# Patient Record
Sex: Female | Born: 1955
Health system: Southern US, Community
[De-identification: ages and names within clinical notes are randomized; demographics above are authoritative.]

## PROBLEM LIST (undated history)

## (undated) DIAGNOSIS — E785 Hyperlipidemia, unspecified: Secondary | ICD-10-CM

## (undated) DIAGNOSIS — E079 Disorder of thyroid, unspecified: Secondary | ICD-10-CM

## (undated) DIAGNOSIS — E0590 Thyrotoxicosis, unspecified without thyrotoxic crisis or storm: Secondary | ICD-10-CM

## (undated) DIAGNOSIS — M1990 Unspecified osteoarthritis, unspecified site: Secondary | ICD-10-CM

## (undated) DIAGNOSIS — E039 Hypothyroidism, unspecified: Secondary | ICD-10-CM

## (undated) DIAGNOSIS — T7840XA Allergy, unspecified, initial encounter: Secondary | ICD-10-CM

## (undated) DIAGNOSIS — I10 Essential (primary) hypertension: Secondary | ICD-10-CM

## (undated) DIAGNOSIS — R945 Abnormal results of liver function studies: Secondary | ICD-10-CM

## (undated) DIAGNOSIS — R7303 Prediabetes: Secondary | ICD-10-CM

## (undated) DIAGNOSIS — R011 Cardiac murmur, unspecified: Secondary | ICD-10-CM

## (undated) DIAGNOSIS — Z9889 Other specified postprocedural states: Secondary | ICD-10-CM

## (undated) DIAGNOSIS — R030 Elevated blood-pressure reading, without diagnosis of hypertension: Secondary | ICD-10-CM

## (undated) DIAGNOSIS — Z973 Presence of spectacles and contact lenses: Secondary | ICD-10-CM

## (undated) DIAGNOSIS — J0100 Acute maxillary sinusitis, unspecified: Secondary | ICD-10-CM

## (undated) DIAGNOSIS — N951 Menopausal and female climacteric states: Secondary | ICD-10-CM

## (undated) DIAGNOSIS — R079 Chest pain, unspecified: Secondary | ICD-10-CM

## (undated) HISTORY — DX: Chest pain, unspecified: R07.9

## (undated) HISTORY — PX: NECK SURGERY: SHX720

## (undated) HISTORY — DX: Hyperlipidemia, unspecified: E78.5

## (undated) HISTORY — DX: Morbid (severe) obesity due to excess calories: E66.01

## (undated) HISTORY — PX: WISDOM TOOTH EXTRACTION: SHX21

## (undated) HISTORY — DX: Prediabetes: R73.03

## (undated) HISTORY — DX: Allergy, unspecified, initial encounter: T78.40XA

## (undated) HISTORY — DX: Unspecified osteoarthritis, unspecified site: M19.90

## (undated) HISTORY — DX: Disorder of thyroid, unspecified: E07.9

## (undated) HISTORY — DX: Elevated blood-pressure reading, without diagnosis of hypertension: R03.0

## (undated) HISTORY — DX: Menopausal and female climacteric states: N95.1

## (undated) HISTORY — PX: OTHER SURGICAL HISTORY: SHX169

## (undated) HISTORY — DX: Thyrotoxicosis, unspecified without thyrotoxic crisis or storm: E05.90

## (undated) HISTORY — DX: Abnormal results of liver function studies: R94.5

## (undated) HISTORY — PX: APPENDECTOMY: SHX54

## (undated) HISTORY — DX: Cardiac murmur, unspecified: R01.1

## (undated) HISTORY — DX: Acute maxillary sinusitis, unspecified: J01.00

---
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
## 1989-08-24 HISTORY — PX: TUBAL LIGATION: SHX77

## 1998-03-30 ENCOUNTER — Other Ambulatory Visit: Admission: RE | Admit: 1998-03-30 | Discharge: 1998-03-30 | Payer: Self-pay | Admitting: Gynecology

## 2000-02-08 ENCOUNTER — Other Ambulatory Visit: Admission: RE | Admit: 2000-02-08 | Discharge: 2000-02-08 | Payer: Self-pay | Admitting: Gynecology

## 2001-06-24 ENCOUNTER — Ambulatory Visit (HOSPITAL_COMMUNITY): Admission: RE | Admit: 2001-06-24 | Discharge: 2001-06-24 | Payer: Self-pay | Admitting: Pulmonary Disease

## 2001-07-10 ENCOUNTER — Encounter: Payer: Self-pay | Admitting: Orthopaedic Surgery

## 2001-07-10 ENCOUNTER — Ambulatory Visit (HOSPITAL_COMMUNITY): Admission: RE | Admit: 2001-07-10 | Discharge: 2001-07-10 | Payer: Self-pay | Admitting: Orthopaedic Surgery

## 2001-09-13 ENCOUNTER — Encounter: Payer: Self-pay | Admitting: Internal Medicine

## 2001-09-13 ENCOUNTER — Emergency Department (HOSPITAL_COMMUNITY): Admission: EM | Admit: 2001-09-13 | Discharge: 2001-09-13 | Payer: Self-pay | Admitting: Internal Medicine

## 2001-12-24 DIAGNOSIS — Z87442 Personal history of urinary calculi: Secondary | ICD-10-CM

## 2001-12-24 HISTORY — DX: Personal history of urinary calculi: Z87.442

## 2002-01-12 ENCOUNTER — Other Ambulatory Visit: Admission: RE | Admit: 2002-01-12 | Discharge: 2002-01-12 | Payer: Self-pay | Admitting: Gynecology

## 2002-01-29 ENCOUNTER — Encounter: Payer: Self-pay | Admitting: Orthopaedic Surgery

## 2002-01-29 ENCOUNTER — Ambulatory Visit (HOSPITAL_COMMUNITY): Admission: RE | Admit: 2002-01-29 | Discharge: 2002-01-29 | Payer: Self-pay | Admitting: Orthopaedic Surgery

## 2002-03-29 ENCOUNTER — Encounter: Payer: Self-pay | Admitting: *Deleted

## 2002-03-30 ENCOUNTER — Inpatient Hospital Stay (HOSPITAL_COMMUNITY): Admission: EM | Admit: 2002-03-30 | Discharge: 2002-03-31 | Payer: Self-pay | Admitting: *Deleted

## 2004-02-14 ENCOUNTER — Other Ambulatory Visit: Admission: RE | Admit: 2004-02-14 | Discharge: 2004-02-14 | Payer: Self-pay | Admitting: Gynecology

## 2005-11-01 ENCOUNTER — Other Ambulatory Visit: Admission: RE | Admit: 2005-11-01 | Discharge: 2005-11-01 | Payer: Self-pay | Admitting: Gynecology

## 2013-12-24 HISTORY — PX: COLONOSCOPY: SHX174

## 2014-07-26 ENCOUNTER — Other Ambulatory Visit (HOSPITAL_COMMUNITY): Payer: Self-pay | Admitting: Pulmonary Disease

## 2014-07-26 ENCOUNTER — Other Ambulatory Visit (HOSPITAL_COMMUNITY): Payer: Self-pay

## 2014-07-26 DIAGNOSIS — R52 Pain, unspecified: Secondary | ICD-10-CM

## 2014-07-26 DIAGNOSIS — R609 Edema, unspecified: Secondary | ICD-10-CM

## 2014-07-27 ENCOUNTER — Ambulatory Visit (HOSPITAL_COMMUNITY)
Admission: RE | Admit: 2014-07-27 | Discharge: 2014-07-27 | Disposition: A | Discharge status: Home / Self Care | Payer: BC Managed Care – PPO | Source: Ambulatory Visit | Attending: Pulmonary Disease | Admitting: Pulmonary Disease

## 2014-07-27 DIAGNOSIS — R52 Pain, unspecified: Secondary | ICD-10-CM

## 2014-07-27 DIAGNOSIS — R609 Edema, unspecified: Secondary | ICD-10-CM

## 2014-07-27 DIAGNOSIS — M79609 Pain in unspecified limb: Secondary | ICD-10-CM | POA: Insufficient documentation

## 2014-07-27 DIAGNOSIS — M7989 Other specified soft tissue disorders: Secondary | ICD-10-CM | POA: Insufficient documentation

## 2014-07-27 DIAGNOSIS — M7120 Synovial cyst of popliteal space [Baker], unspecified knee: Secondary | ICD-10-CM | POA: Insufficient documentation

## 2014-09-09 ENCOUNTER — Encounter: Payer: Self-pay | Admitting: Internal Medicine

## 2014-09-22 IMAGING — US US EXTREM LOW VENOUS*L*
1 series · 13 of 24 positions shown · non-contrast
Comparison: None.

CLINICAL DATA: Left lower extremity pain and swelling



[Series 1: us extrem low venous*left* · 0.08mm/px · 13 of 49 slices shown]
[im 1/49]
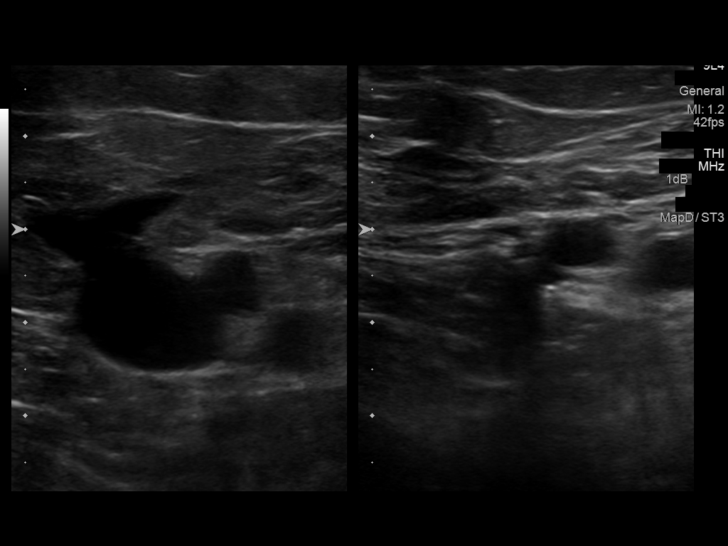
[im 5/49]
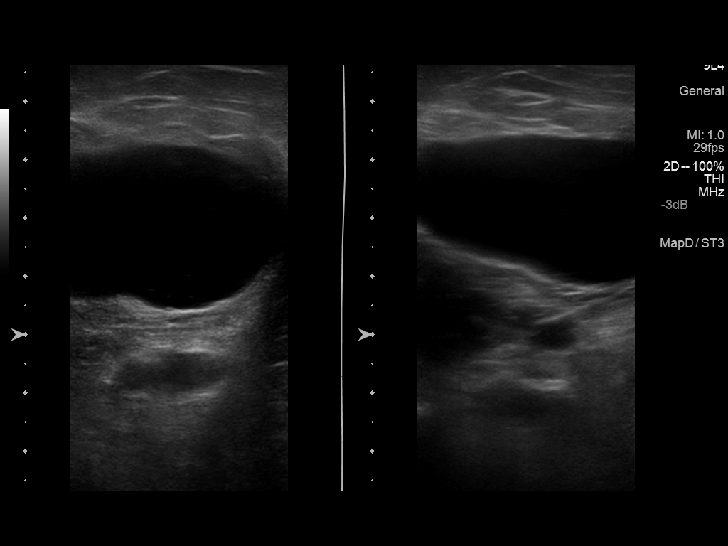
[im 9/49]
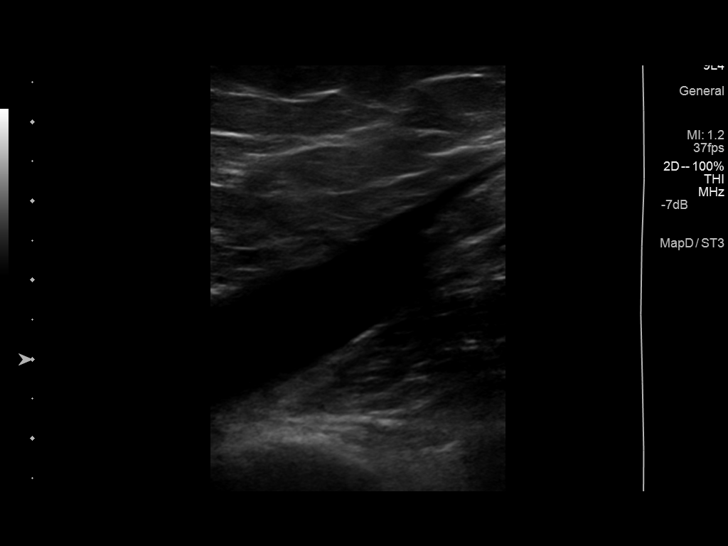
[im 13/49]
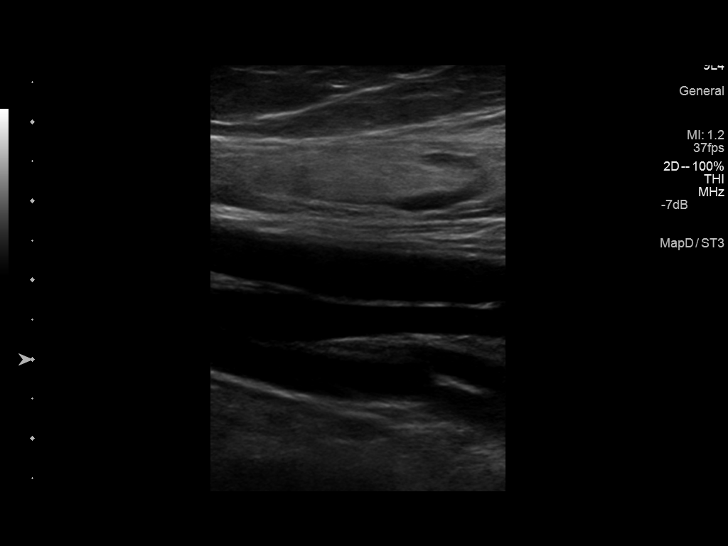
[im 17/49]
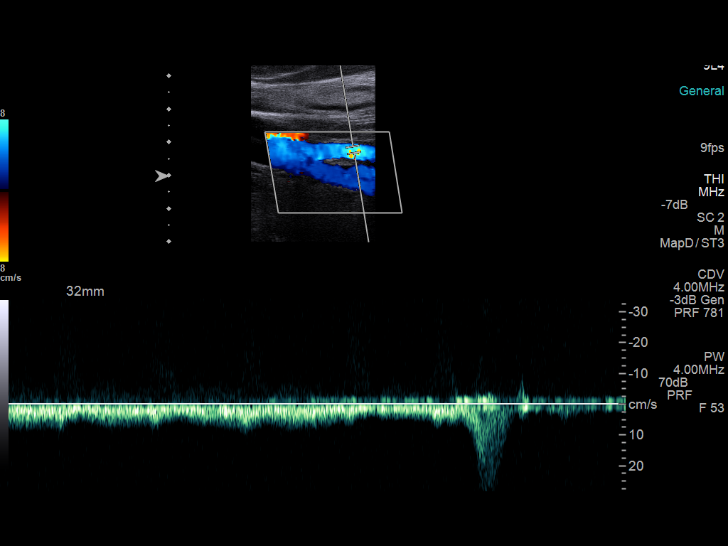
[im 21/49]
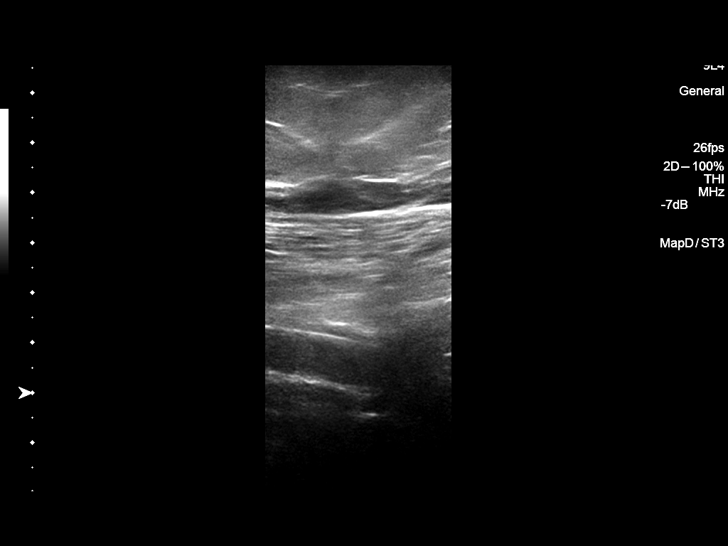
[im 26/49]
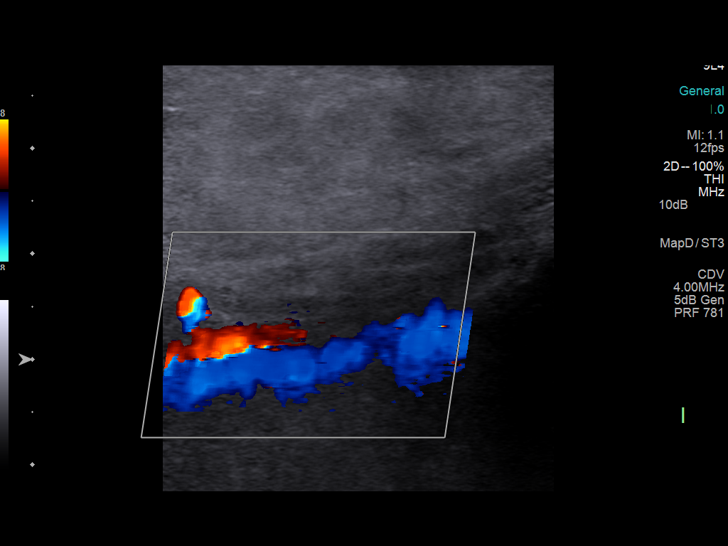
[im 28/49]
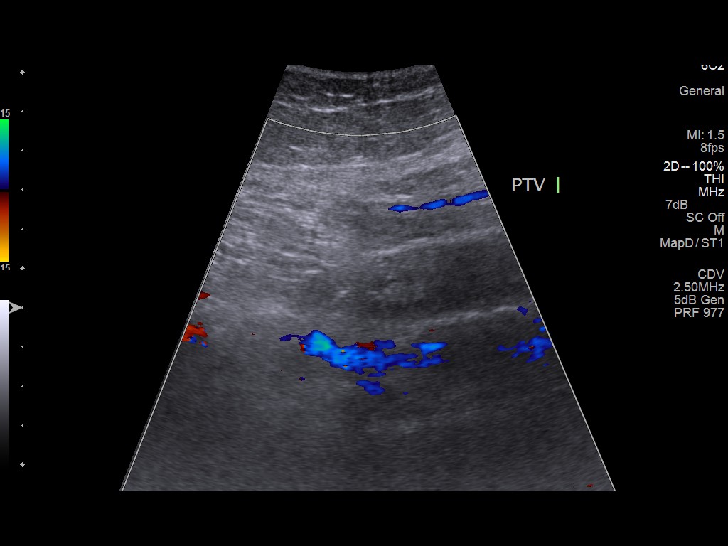
[im 32/49]
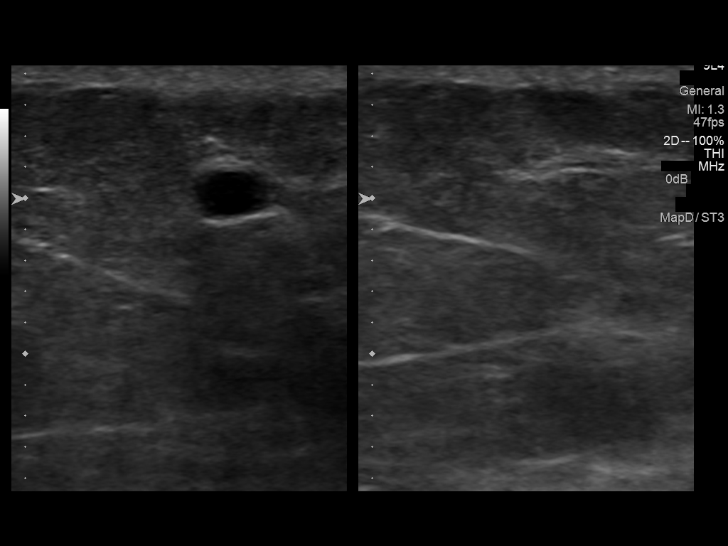
[im 36/49]
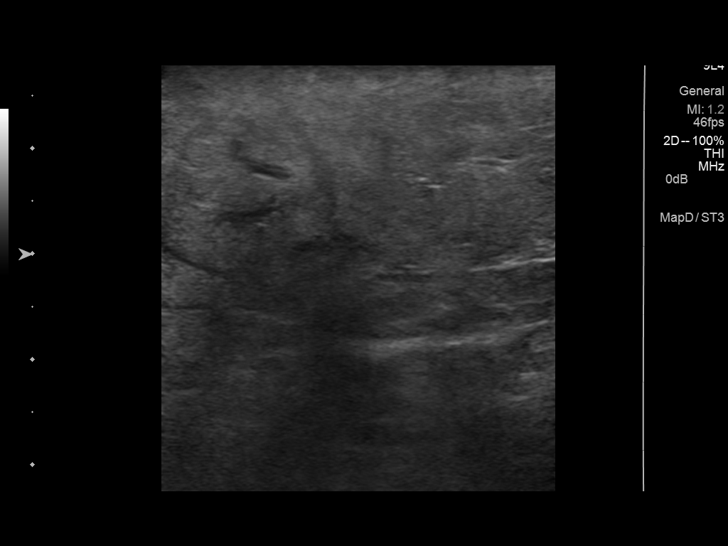
[im 40/49]
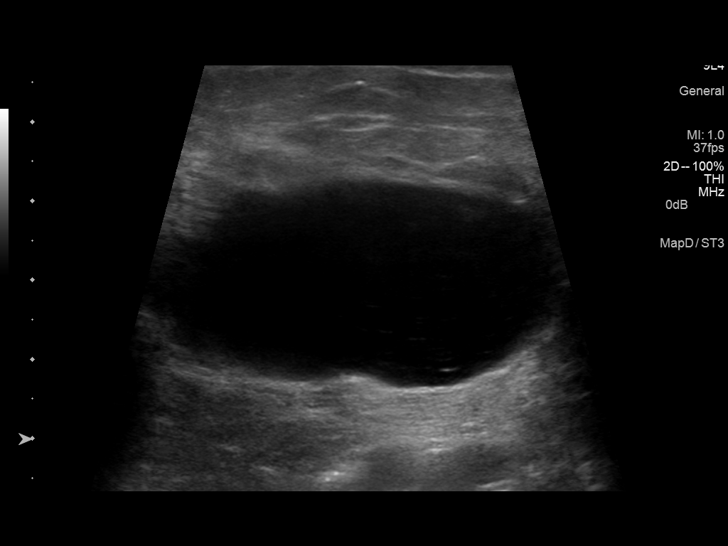
[im 44/49]
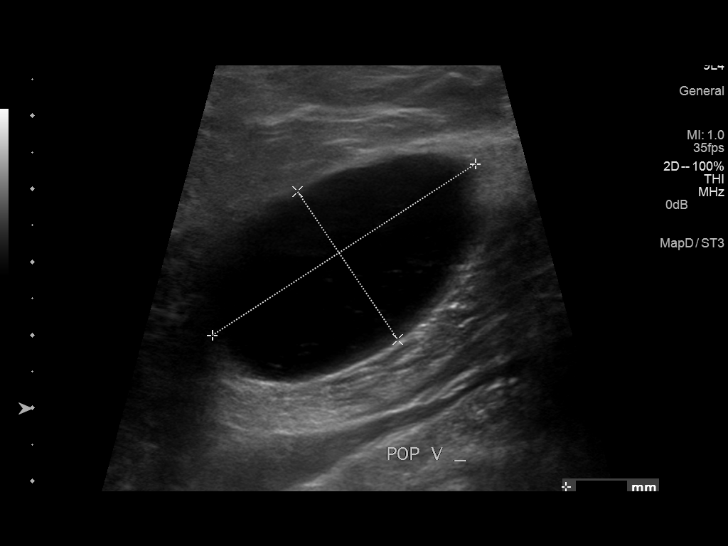
[im 49/49]
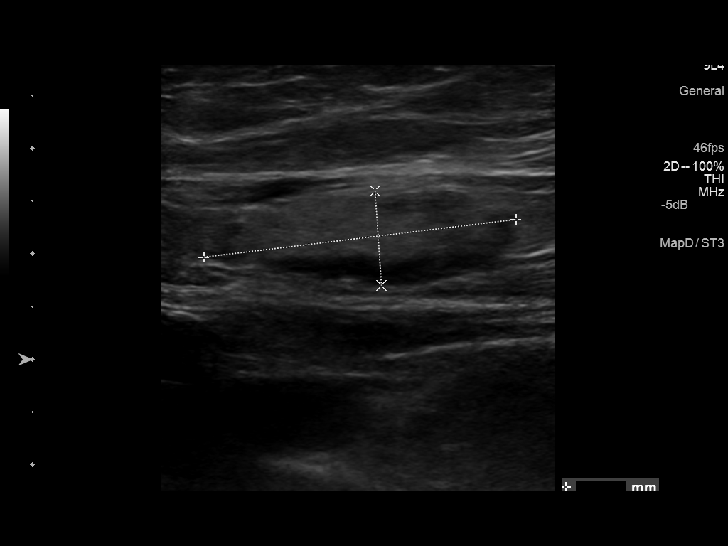

[13 of 24 positions shown; findings below may reference images not displayed]

FINDINGS: Common Femoral Vein: No evidence of thrombus. Normal
compressibility, respiratory phasicity and response to augmentation.

Saphenofemoral Junction: No evidence of thrombus. Normal
compressibility and flow on color Doppler imaging.

Profunda Femoral Vein: No evidence of thrombus. Normal
compressibility and flow on color Doppler imaging.

Femoral Vein: No evidence of thrombus. Normal compressibility,
respiratory phasicity and response to augmentation.

Popliteal Vein: No evidence of thrombus. Normal compressibility,
respiratory phasicity and response to augmentation.

Calf Veins: No evidence of thrombus. Normal compressibility and flow
on color Doppler imaging.

Superficial Great Saphenous Vein: No evidence of thrombus. Normal
compressibility and flow on color Doppler imaging.

Venous Reflux:  None.

Other Findings: Left popliteal fossa hypoechoic cyst noted measures
4.3 x 2.5 x 5.3 cm compatible with a Baker's cyst.

Peripheral subcutaneous calf edema noted. Prominent left inguinal
fatty replaced lymph node measures 3.0 x 1.0 x 3.4 cm.
IMPRESSION: Negative for left lower extremity DVT

4.3 x 5.3 cm Baker's cyst

Prominent left inguinal lymph node

## 2014-10-28 ENCOUNTER — Encounter: Payer: Self-pay | Admitting: Internal Medicine

## 2014-11-15 ENCOUNTER — Encounter: Payer: BC Managed Care – PPO | Admitting: Internal Medicine

## 2014-12-27 ENCOUNTER — Ambulatory Visit (AMBULATORY_SURGERY_CENTER): Payer: Self-pay | Admitting: *Deleted

## 2014-12-27 VITALS — Ht 67.0 in | Wt 259.8 lb

## 2014-12-27 DIAGNOSIS — Z1211 Encounter for screening for malignant neoplasm of colon: Secondary | ICD-10-CM

## 2014-12-27 MED ORDER — MOVIPREP 100 G PO SOLR: 1.0000 | Freq: Once | ORAL | Status: DC

## 2014-12-27 NOTE — Progress Notes (Signed)
No egg or soy allergy. ewm Pt states she has post op N/V with each surgery she has ever had. ewm No home 02 use. ewm Pt declined emmi video. ewm

## 2015-01-03 ENCOUNTER — Ambulatory Visit (AMBULATORY_SURGERY_CENTER): Payer: BLUE CROSS/BLUE SHIELD | Admitting: Internal Medicine

## 2015-01-03 ENCOUNTER — Encounter: Payer: Self-pay | Admitting: Internal Medicine

## 2015-01-03 VITALS — BP 151/107 | HR 63 | Temp 96.9°F | Resp 28 | Ht 67.0 in | Wt 259.0 lb

## 2015-01-03 DIAGNOSIS — Z1211 Encounter for screening for malignant neoplasm of colon: Secondary | ICD-10-CM

## 2015-01-03 MED ORDER — SODIUM CHLORIDE 0.9 % IV SOLN: 500.0000 mL | INTRAVENOUS | Status: DC

## 2015-01-03 NOTE — Op Note (Signed)
[facility] [address] [place], [id number]   COLONOSCOPY PROCEDURE REPORT  PATIENT: Karina Sawyer, Karina Sawyer  MR#: [id number] BIRTHDATE: Jul 06, 1956 , 58  yrs. old GENDER: female ENDOSCOPIST: Roxy CedarJohn N Perry Jr, MD REFERRED XB:JYNWGBY:Scott Greta DoomBowie, M.D. PROCEDURE DATE:  01/03/2015 PROCEDURE:   Colonoscopy, screening First Screening Colonoscopy - Avg.  risk and is 50 yrs.  old or older Yes.  Prior Negative Screening - Now for repeat screening. N/A  History of Adenoma - Now for follow-up colonoscopy & has been > or = to 3 yrs.  N/A  Polyps Removed Today? No.  Recommend repeat exam, <10 yrs? No. ASA CLASS:   Class II INDICATIONS:average risk for colorectal cancer. MEDICATIONS: Monitored anesthesia care and Propofol 270 mg IV  DESCRIPTION OF PROCEDURE:   After the risks benefits and alternatives of the procedure were thoroughly explained, informed consent was obtained.  The digital rectal exam revealed no abnormalities of the rectum.   The LB [id number]  endoscope was introduced through the anus and advanced to the cecum, which was identified by both the appendix and ileocecal valve. No adverse events experienced.   The quality of the prep was excellent, using MoviPrep  The instrument was then slowly withdrawn as the colon was fully examined.    COLON FINDINGS: There was moderate diverticulosis noted in the left colon.   The examination was otherwise normal. No polyps or other abnormalities identified.  Retroflexed views revealed internal hemorrhoids. The time to cecum=2 minutes 10 seconds.  Withdrawal time=9 minutes 27 seconds.  The scope was withdrawn and the procedure completed.  COMPLICATIONS: There were no immediate complications.  ENDOSCOPIC IMPRESSION: 1.   Moderate diverticulosis was noted in the left colon 2.   The examination was otherwise normal  RECOMMENDATIONS: 1. Continue current colorectal screening recommendations for "routine risk" patients with a  repeat colonoscopy in 10 years.  eSigned:  Roxy CedarJohn N Perry Jr, MD 01/03/2015 11:58 AM   cc: Lodema HongScott Bowie, MD, Shaune PollackEd Hawkins, MD, and The Patient

## 2015-01-03 NOTE — Progress Notes (Signed)
A/ox3 pleased with MAC, report to Suzanne RN 

## 2015-01-03 NOTE — Patient Instructions (Signed)
Discharge instructions given. Handouts on diverticulosis and a high fiber diet. Resume previous medications. YOU HAD AN ENDOSCOPIC PROCEDURE TODAY AT THE [facility]: Refer to the procedure report that was given to you for any specific questions about what was found during the examination.  If the procedure report does not answer your questions, please call your gastroenterologist to clarify.  If you requested that your care partner not be given the details of your procedure findings, then the procedure report has been included in a sealed envelope for you to review at your convenience later.  YOU SHOULD EXPECT: Some feelings of bloating in the abdomen. Passage of more gas than usual.  Walking can help get rid of the air that was put into your GI tract during the procedure and reduce the bloating. If you had a lower endoscopy (such as a colonoscopy or flexible sigmoidoscopy) you may notice spotting of blood in your stool or on the toilet paper. If you underwent a bowel prep for your procedure, then you may not have a normal bowel movement for a few days.  DIET: Your first meal following the procedure should be a light meal and then it is ok to progress to your normal diet.  A half-sandwich or bowl of soup is an example of a good first meal.  Heavy or fried foods are harder to digest and may make you feel nauseous or bloated.  Likewise meals heavy in dairy and vegetables can cause extra gas to form and this can also increase the bloating.  Drink plenty of fluids but you should avoid alcoholic beverages for 24 hours.  ACTIVITY: Your care partner should take you home directly after the procedure.  You should plan to take it easy, moving slowly for the rest of the day.  You can resume normal activity the day after the procedure however you should NOT DRIVE or use heavy machinery for 24 hours (because of the sedation medicines used during the test).    SYMPTOMS TO REPORT IMMEDIATELY: A  gastroenterologist can be reached at any hour.  During normal business hours, 8:30 AM to 5:00 PM Monday through Friday, call [phone].  After hours and on weekends, please call the GI answering service at [phone] who will take a message and have the physician on call contact you.   Following lower endoscopy (colonoscopy or flexible sigmoidoscopy):  Excessive amounts of blood in the stool  Significant tenderness or worsening of abdominal pains  Swelling of the abdomen that is new, acute  Fever of 100F or higher  FOLLOW UP: If any biopsies were taken you will be contacted by phone or by letter within the next 1-3 weeks.  Call your gastroenterologist if you have not heard about the biopsies in 3 weeks.  Our staff will call the home number listed on your records the next business day following your procedure to check on you and address any questions or concerns that you may have at that time regarding the information given to you following your procedure. This is a courtesy call and so if there is no answer at the home number and we have not heard from you through the emergency physician on call, we will assume that you have returned to your regular daily activities without incident.  SIGNATURES/CONFIDENTIALITY: You and/or your care partner have signed paperwork which will be entered into your electronic medical record.  These signatures attest to the fact that that the information above on your After Visit Summary   has been reviewed and is understood.  Full responsibility of the confidentiality of this discharge information lies with you and/or your care-partner. 

## 2015-01-04 ENCOUNTER — Telehealth: Payer: Self-pay

## 2015-01-04 NOTE — Telephone Encounter (Signed)
  Follow up Call-  Call back number 01/03/2015  Post procedure Call Back phone  # [phone]  Permission to leave phone message Yes     Patient questions:  Do you have a fever, pain , or abdominal swelling? No. Pain Score  0 *  Have you tolerated food without any problems? Yes.    Have you been able to return to your normal activities? Yes.    Do you have any questions about your discharge instructions: Diet   No. Medications  No. Follow up visit  No.  Do you have questions or concerns about your Care? No.  Actions: * If pain score is 4 or above: No action needed, pain <4.

## 2016-02-15 ENCOUNTER — Ambulatory Visit: Payer: BLUE CROSS/BLUE SHIELD | Admitting: Orthopaedic Surgery

## 2016-02-21 ENCOUNTER — Ambulatory Visit: Payer: BLUE CROSS/BLUE SHIELD

## 2016-02-21 ENCOUNTER — Ambulatory Visit (INDEPENDENT_AMBULATORY_CARE_PROVIDER_SITE_OTHER): Payer: BLUE CROSS/BLUE SHIELD | Admitting: Orthopaedic Surgery

## 2016-02-21 VITALS — BP 161/86 | HR 67 | Temp 97.9°F | Ht 66.0 in | Wt 257.6 lb

## 2016-02-21 DIAGNOSIS — M25512 Pain in left shoulder: Secondary | ICD-10-CM | POA: Diagnosis not present

## 2016-02-21 MED ORDER — HYDROCODONE-ACETAMINOPHEN 5-325 MG PO TABS: 1.0000 | ORAL_TABLET | ORAL | Status: DC | PRN

## 2016-02-21 NOTE — Progress Notes (Addendum)
Subjective:  CC:  Left shoulder pain   Patient ID: Karina Sawyer, female    DOB: 10/01/1956, 60 y.o.   MRN: [id number]  Shoulder Pain  The pain is present in the left shoulder. This is a new problem. The current episode started more than 1 month ago. There has been no history of extremity trauma. The problem occurs daily. The problem has been gradually worsening. The quality of the pain is described as aching, burning and dull. The pain is at a severity of 5/10. The pain is moderate. Associated symptoms include a limited range of motion. Pertinent negatives include no joint locking, joint swelling, numbness, stiffness or tingling. The symptoms are aggravated by lying down and activity. She has tried acetaminophen, OTC ointments and OTC pain meds for the symptoms. The treatment provided mild relief.      Review of Systems  Constitutional:       She does not have diabetes She does not have hypertension She does not have COPD She does not smoke  HENT: Positive for sinus pressure. Negative for congestion.   Respiratory: Negative for cough and shortness of breath.   Cardiovascular: Negative for chest pain.  Musculoskeletal: Positive for myalgias, back pain and arthralgias. Negative for stiffness.  Allergic/Immunologic: Positive for environmental allergies.  Neurological: Negative for tingling and numbness.   The patient has a family history of hypertension  Social History   Social History  . Marital Status: Married    Spouse Name: N/A  . Number of Children: N/A  . Years of Education: N/A   Occupational History  . Not on file.   Social History Main Topics  . Smoking status: Never Smoker   . Smokeless tobacco: Never Used  . Alcohol Use: No  . Drug Use: No  . Sexual Activity: Not on file   Other Topics Concern  . Not on file   Social History Narrative   Past Surgical History  Procedure Laterality Date  . Ovary removed      age 89  . 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 Appendectomy    . Wisdom tooth extraction    . Tubal ligation  1990's    early 90's    BP 161/86 mmHg  Pulse 67  Temp(Src) 97.9 F (36.6 C)  Ht  (1.676 m)  Wt 257 lb 9.6 oz (116.847 kg)  BMI 41.60 kg/m2     Objective:   Physical Exam  Constitutional: She is oriented to person, place, and time. She appears well-developed and well-nourished.  HENT:  Head: Normocephalic and atraumatic.  Eyes: Conjunctivae and EOM are normal. Pupils are equal, round, and reactive to light.  Neck: Neck supple.  Cardiovascular: Normal rate, regular rhythm, normal heart sounds and intact distal pulses.   Pulmonary/Chest: Effort normal and breath sounds normal.  Abdominal: Soft.  Musculoskeletal: She exhibits tenderness (Pain of the left shoulder wtih decreased motion.  NV intact.).       Left shoulder: She exhibits decreased range of motion, tenderness and pain.       Arms: Neurological: She is alert and oriented to person, place, and time. She has normal reflexes. She displays normal reflexes. No cranial nerve deficit. She exhibits normal muscle tone. Coordination normal.  Skin: Skin is warm and dry.  Psychiatric: She has a normal mood and affect. Her behavior is normal. Judgment and thought content normal.   Examination of left Upper Extremity is done.  Inspection:  Overall:  Elbow non-tender without crepitus or defects, forearm non-tender without crepitus or defects, wrist non-tender without crepitus or defects, hand non-tender.    Shoulder: with glenohumeral joint tenderness, without effusion.   Upper arm: without swelling and tenderness   Range of motion:   Overall:  Full range of motion of the elbow, full range of motion of wrist and full range of motion in fingers.   Shoulder:  left  100 degrees forward flexion; 75 degrees abduction; 30 degrees internal rotation, 30 degrees external rotation, 20 degrees extension, 35 degrees adduction.   Stability:   Overall:  Shoulder,  elbow and wrist stable   Strength and Tone:   Overall full shoulder muscles strength, full upper arm strength and normal upper arm bulk and tone.  The patient request injection, verbal consent was obtained.  The left shoulder was prepped appropriately after time out was performed.   Sterile technique was observed and injection of 1 cc of Depo-Medrol 40 mg with several cc's of plain xylocaine. Anesthesia was provided by ethyl chloride and a 20-gauge needle was used to inject the shoulder area. A posterior approach was used.  The injection was tolerated well.  A band aid dressing was applied.  The patient was advised to apply ice later today and tomorrow to the injection sight as needed.     Encounter Diagnosis  Name Primary?  . Left shoulder pain Yes    Assessment & Plan:  She has bursitis of the left shoulder.  She may have a rotator cuff tear.  She may need MRI.  I have gone over exercises for her to do at home.  She is to add Aleve one bid pc daily.  I have given pain medicine as needed.  She may need MRI if not improved by next visit.  She is to call if any problem.

## 2016-03-13 ENCOUNTER — Ambulatory Visit: Payer: BLUE CROSS/BLUE SHIELD | Admitting: Orthopedic Surgery

## 2016-03-21 NOTE — Addendum Note (Signed)
Addended by: Earnstine RegalKEELING, JOHN W on: 03/21/2016 09:32 PM   Modules accepted: Kipp BroodSmartSet

## 2016-03-27 DIAGNOSIS — M25512 Pain in left shoulder: Secondary | ICD-10-CM | POA: Diagnosis not present

## 2016-03-27 DIAGNOSIS — M542 Cervicalgia: Secondary | ICD-10-CM | POA: Diagnosis not present

## 2016-04-19 DIAGNOSIS — M25512 Pain in left shoulder: Secondary | ICD-10-CM | POA: Diagnosis not present

## 2016-05-08 DIAGNOSIS — M25512 Pain in left shoulder: Secondary | ICD-10-CM | POA: Diagnosis not present

## 2016-05-09 DIAGNOSIS — M25512 Pain in left shoulder: Secondary | ICD-10-CM | POA: Diagnosis not present

## 2016-05-09 DIAGNOSIS — Z23 Encounter for immunization: Secondary | ICD-10-CM | POA: Diagnosis not present

## 2016-05-09 DIAGNOSIS — E039 Hypothyroidism, unspecified: Secondary | ICD-10-CM | POA: Diagnosis not present

## 2016-05-09 DIAGNOSIS — E669 Obesity, unspecified: Secondary | ICD-10-CM | POA: Diagnosis not present

## 2016-05-09 DIAGNOSIS — E269 Hyperaldosteronism, unspecified: Secondary | ICD-10-CM | POA: Diagnosis not present

## 2017-02-13 DIAGNOSIS — Z6839 Body mass index (BMI) 39.0-39.9, adult: Secondary | ICD-10-CM | POA: Diagnosis not present

## 2017-02-13 DIAGNOSIS — Z1231 Encounter for screening mammogram for malignant neoplasm of breast: Secondary | ICD-10-CM | POA: Diagnosis not present

## 2017-02-13 DIAGNOSIS — Z01419 Encounter for gynecological examination (general) (routine) without abnormal findings: Secondary | ICD-10-CM | POA: Diagnosis not present

## 2017-02-13 DIAGNOSIS — Z124 Encounter for screening for malignant neoplasm of cervix: Secondary | ICD-10-CM | POA: Diagnosis not present

## 2017-03-12 DIAGNOSIS — M25512 Pain in left shoulder: Secondary | ICD-10-CM | POA: Diagnosis not present

## 2017-05-09 DIAGNOSIS — E785 Hyperlipidemia, unspecified: Secondary | ICD-10-CM | POA: Diagnosis not present

## 2017-05-09 DIAGNOSIS — E669 Obesity, unspecified: Secondary | ICD-10-CM | POA: Diagnosis not present

## 2017-05-09 DIAGNOSIS — E039 Hypothyroidism, unspecified: Secondary | ICD-10-CM | POA: Diagnosis not present

## 2017-05-09 DIAGNOSIS — M25512 Pain in left shoulder: Secondary | ICD-10-CM | POA: Diagnosis not present

## 2017-11-20 DIAGNOSIS — H40053 Ocular hypertension, bilateral: Secondary | ICD-10-CM | POA: Diagnosis not present

## 2018-02-19 DIAGNOSIS — Z6839 Body mass index (BMI) 39.0-39.9, adult: Secondary | ICD-10-CM | POA: Diagnosis not present

## 2018-02-19 DIAGNOSIS — Z01419 Encounter for gynecological examination (general) (routine) without abnormal findings: Secondary | ICD-10-CM | POA: Diagnosis not present

## 2018-02-19 DIAGNOSIS — Z1231 Encounter for screening mammogram for malignant neoplasm of breast: Secondary | ICD-10-CM | POA: Diagnosis not present

## 2018-03-07 DIAGNOSIS — H40053 Ocular hypertension, bilateral: Secondary | ICD-10-CM | POA: Diagnosis not present

## 2018-03-12 DIAGNOSIS — H40053 Ocular hypertension, bilateral: Secondary | ICD-10-CM | POA: Diagnosis not present

## 2018-04-01 DIAGNOSIS — H40053 Ocular hypertension, bilateral: Secondary | ICD-10-CM | POA: Diagnosis not present

## 2018-05-09 DIAGNOSIS — E039 Hypothyroidism, unspecified: Secondary | ICD-10-CM | POA: Diagnosis not present

## 2018-05-09 DIAGNOSIS — M25512 Pain in left shoulder: Secondary | ICD-10-CM | POA: Diagnosis not present

## 2018-05-09 DIAGNOSIS — E669 Obesity, unspecified: Secondary | ICD-10-CM | POA: Diagnosis not present

## 2018-05-09 DIAGNOSIS — J301 Allergic rhinitis due to pollen: Secondary | ICD-10-CM | POA: Diagnosis not present

## 2019-02-06 DIAGNOSIS — Z2821 Immunization not carried out because of patient refusal: Secondary | ICD-10-CM | POA: Diagnosis not present

## 2019-02-06 DIAGNOSIS — M1380 Other specified arthritis, unspecified site: Secondary | ICD-10-CM | POA: Diagnosis not present

## 2019-02-06 DIAGNOSIS — E039 Hypothyroidism, unspecified: Secondary | ICD-10-CM | POA: Diagnosis not present

## 2019-03-11 DIAGNOSIS — Z1231 Encounter for screening mammogram for malignant neoplasm of breast: Secondary | ICD-10-CM | POA: Diagnosis not present

## 2019-03-11 DIAGNOSIS — Z124 Encounter for screening for malignant neoplasm of cervix: Secondary | ICD-10-CM | POA: Diagnosis not present

## 2019-03-11 DIAGNOSIS — Z6832 Body mass index (BMI) 32.0-32.9, adult: Secondary | ICD-10-CM | POA: Diagnosis not present

## 2019-03-11 DIAGNOSIS — Z01419 Encounter for gynecological examination (general) (routine) without abnormal findings: Secondary | ICD-10-CM | POA: Diagnosis not present

## 2019-04-09 DIAGNOSIS — Z0000 Encounter for general adult medical examination without abnormal findings: Secondary | ICD-10-CM | POA: Diagnosis not present

## 2019-04-09 DIAGNOSIS — E039 Hypothyroidism, unspecified: Secondary | ICD-10-CM | POA: Diagnosis not present

## 2019-04-16 DIAGNOSIS — M1991 Primary osteoarthritis, unspecified site: Secondary | ICD-10-CM | POA: Diagnosis not present

## 2019-04-16 DIAGNOSIS — E039 Hypothyroidism, unspecified: Secondary | ICD-10-CM | POA: Diagnosis not present

## 2019-04-16 DIAGNOSIS — N951 Menopausal and female climacteric states: Secondary | ICD-10-CM | POA: Diagnosis not present

## 2019-07-08 DIAGNOSIS — H40013 Open angle with borderline findings, low risk, bilateral: Secondary | ICD-10-CM | POA: Diagnosis not present

## 2019-09-23 DIAGNOSIS — M25562 Pain in left knee: Secondary | ICD-10-CM | POA: Diagnosis not present

## 2020-01-05 DIAGNOSIS — D2372 Other benign neoplasm of skin of left lower limb, including hip: Secondary | ICD-10-CM | POA: Diagnosis not present

## 2020-01-05 DIAGNOSIS — D2272 Melanocytic nevi of left lower limb, including hip: Secondary | ICD-10-CM | POA: Diagnosis not present

## 2020-01-05 DIAGNOSIS — D485 Neoplasm of uncertain behavior of skin: Secondary | ICD-10-CM | POA: Diagnosis not present

## 2020-01-05 DIAGNOSIS — D2271 Melanocytic nevi of right lower limb, including hip: Secondary | ICD-10-CM | POA: Diagnosis not present

## 2020-01-05 DIAGNOSIS — D225 Melanocytic nevi of trunk: Secondary | ICD-10-CM | POA: Diagnosis not present

## 2020-01-05 DIAGNOSIS — D2239 Melanocytic nevi of other parts of face: Secondary | ICD-10-CM | POA: Diagnosis not present

## 2020-04-07 DIAGNOSIS — Z712 Person consulting for explanation of examination or test findings: Secondary | ICD-10-CM | POA: Diagnosis not present

## 2020-04-07 DIAGNOSIS — N951 Menopausal and female climacteric states: Secondary | ICD-10-CM | POA: Diagnosis not present

## 2020-04-07 DIAGNOSIS — E039 Hypothyroidism, unspecified: Secondary | ICD-10-CM | POA: Diagnosis not present

## 2020-04-07 DIAGNOSIS — M1991 Primary osteoarthritis, unspecified site: Secondary | ICD-10-CM | POA: Diagnosis not present

## 2020-04-12 DIAGNOSIS — Z0001 Encounter for general adult medical examination with abnormal findings: Secondary | ICD-10-CM | POA: Diagnosis not present

## 2020-04-26 DIAGNOSIS — R2243 Localized swelling, mass and lump, lower limb, bilateral: Secondary | ICD-10-CM | POA: Diagnosis not present

## 2020-04-26 DIAGNOSIS — I10 Essential (primary) hypertension: Secondary | ICD-10-CM | POA: Diagnosis not present

## 2020-04-27 DIAGNOSIS — Z1231 Encounter for screening mammogram for malignant neoplasm of breast: Secondary | ICD-10-CM | POA: Diagnosis not present

## 2020-04-27 DIAGNOSIS — Z01419 Encounter for gynecological examination (general) (routine) without abnormal findings: Secondary | ICD-10-CM | POA: Diagnosis not present

## 2020-04-27 DIAGNOSIS — Z124 Encounter for screening for malignant neoplasm of cervix: Secondary | ICD-10-CM | POA: Diagnosis not present

## 2020-04-27 DIAGNOSIS — Z6839 Body mass index (BMI) 39.0-39.9, adult: Secondary | ICD-10-CM | POA: Diagnosis not present

## 2020-07-12 DIAGNOSIS — J069 Acute upper respiratory infection, unspecified: Secondary | ICD-10-CM | POA: Diagnosis not present

## 2020-10-14 DIAGNOSIS — I10 Essential (primary) hypertension: Secondary | ICD-10-CM | POA: Diagnosis not present

## 2020-10-14 DIAGNOSIS — N951 Menopausal and female climacteric states: Secondary | ICD-10-CM | POA: Diagnosis not present

## 2020-10-14 DIAGNOSIS — M1991 Primary osteoarthritis, unspecified site: Secondary | ICD-10-CM | POA: Diagnosis not present

## 2020-10-14 DIAGNOSIS — E039 Hypothyroidism, unspecified: Secondary | ICD-10-CM | POA: Diagnosis not present

## 2020-10-14 DIAGNOSIS — R945 Abnormal results of liver function studies: Secondary | ICD-10-CM | POA: Diagnosis not present

## 2020-10-14 DIAGNOSIS — E6609 Other obesity due to excess calories: Secondary | ICD-10-CM | POA: Diagnosis not present

## 2020-10-21 ENCOUNTER — Other Ambulatory Visit (HOSPITAL_COMMUNITY): Payer: Self-pay | Admitting: Internal Medicine

## 2020-10-21 DIAGNOSIS — Z13820 Encounter for screening for osteoporosis: Secondary | ICD-10-CM

## 2020-10-21 DIAGNOSIS — M1991 Primary osteoarthritis, unspecified site: Secondary | ICD-10-CM | POA: Diagnosis not present

## 2020-10-21 DIAGNOSIS — R945 Abnormal results of liver function studies: Secondary | ICD-10-CM | POA: Diagnosis not present

## 2020-10-21 DIAGNOSIS — N951 Menopausal and female climacteric states: Secondary | ICD-10-CM | POA: Diagnosis not present

## 2020-10-21 DIAGNOSIS — E039 Hypothyroidism, unspecified: Secondary | ICD-10-CM | POA: Diagnosis not present

## 2020-12-02 ENCOUNTER — Other Ambulatory Visit: Payer: Self-pay

## 2020-12-02 ENCOUNTER — Ambulatory Visit (HOSPITAL_COMMUNITY)
Admission: RE | Admit: 2020-12-02 | Discharge: 2020-12-02 | Disposition: A | Discharge status: Home / Self Care | Payer: BC Managed Care – PPO | Source: Ambulatory Visit | Attending: Internal Medicine | Admitting: Internal Medicine

## 2020-12-02 DIAGNOSIS — Z13820 Encounter for screening for osteoporosis: Secondary | ICD-10-CM

## 2020-12-02 DIAGNOSIS — Z780 Asymptomatic menopausal state: Secondary | ICD-10-CM | POA: Diagnosis not present

## 2020-12-02 DIAGNOSIS — R29890 Loss of height: Secondary | ICD-10-CM | POA: Diagnosis not present

## 2020-12-02 DIAGNOSIS — Z8739 Personal history of other diseases of the musculoskeletal system and connective tissue: Secondary | ICD-10-CM | POA: Diagnosis not present

## 2020-12-24 DIAGNOSIS — R079 Chest pain, unspecified: Secondary | ICD-10-CM

## 2020-12-24 HISTORY — DX: Chest pain, unspecified: R07.9

## 2021-03-22 DIAGNOSIS — E6609 Other obesity due to excess calories: Secondary | ICD-10-CM | POA: Diagnosis not present

## 2021-03-22 DIAGNOSIS — E039 Hypothyroidism, unspecified: Secondary | ICD-10-CM | POA: Diagnosis not present

## 2021-03-22 DIAGNOSIS — R945 Abnormal results of liver function studies: Secondary | ICD-10-CM | POA: Diagnosis not present

## 2021-03-22 DIAGNOSIS — E782 Mixed hyperlipidemia: Secondary | ICD-10-CM | POA: Diagnosis not present

## 2021-04-21 DIAGNOSIS — Z0001 Encounter for general adult medical examination with abnormal findings: Secondary | ICD-10-CM | POA: Diagnosis not present

## 2021-04-21 DIAGNOSIS — R0789 Other chest pain: Secondary | ICD-10-CM | POA: Diagnosis not present

## 2021-04-26 ENCOUNTER — Other Ambulatory Visit: Payer: Self-pay

## 2021-04-26 ENCOUNTER — Encounter (INDEPENDENT_AMBULATORY_CARE_PROVIDER_SITE_OTHER): Payer: Self-pay

## 2021-04-26 ENCOUNTER — Ambulatory Visit (INDEPENDENT_AMBULATORY_CARE_PROVIDER_SITE_OTHER): Payer: BC Managed Care – PPO | Admitting: Cardiology

## 2021-04-26 ENCOUNTER — Encounter: Payer: Self-pay | Admitting: Cardiology

## 2021-04-26 VITALS — BP 112/80 | HR 58 | Ht 66.0 in | Wt 268.0 lb

## 2021-04-26 DIAGNOSIS — R0789 Other chest pain: Secondary | ICD-10-CM | POA: Diagnosis not present

## 2021-04-26 DIAGNOSIS — R9431 Abnormal electrocardiogram [ECG] [EKG]: Secondary | ICD-10-CM | POA: Diagnosis not present

## 2021-04-26 NOTE — Progress Notes (Signed)
Cardiology Office Note:    Date:  04/26/2021   ID:  Karina Sawyer, DOB 1956-07-26, MRN [id number]  PCP:  Benita Stabile, MD   [facility] Providers Cardiologist:  Donato Schultz, MD [facility]    Referring MD: Kari Baars, MD    History of Present Illness:    Karina Sawyer is a 65 y.o. female here for the evaluation of abnormal EKG at the request of Benita Stabile. She is a non-smoker with elevated blood pressure, hypothyroidism, mixed hyperlipidemia. She is accompanied by her daughter and is doing well today. She states that she was diagnosed with a heart murmur years ago however she was recommended to be seen here for evaluation because her murmur never resolved. Occasionally she has sharp chest pain located in the center of her chest. She describes the pain as a knife piercing her heart. After drinking a glass of cold water and taking deep breaths her symptoms resolved. This episode occurred while she was standing at her work's [employer] person. She denies having any exertional chest pain, tightness, or pressure. She denies any dizziness, PND, orthopnea, or lightheadedness. She has some family history of MI and strokes.   Past Medical History:  Diagnosis Date  . Abnormal results of liver function studies   . Acute maxillary sinusitis   . Allergy   . Arthritis   . Chest pain   . Elevated blood pressure reading in office without diagnosis of hypertension   . Heart murmur    many years ago told had a murmur but no one recenctly mentioned  . Hyperlipidemia   . Hyperthyroidism   . Menopausal and female climacteric states   . Morbid obesity (HCC)   . Osteoarthritis   . Prediabetes   . Thyroid disease     Past Surgical History:  Procedure Laterality Date  . APPENDECTOMY    . NECK SURGERY     growth removed at birth  . ovary removed     age 27  . TUBAL LIGATION  1990's   early 90's  . WISDOM TOOTH EXTRACTION      Current Medications: Current Meds  Medication Sig   . aspirin 81 MG tablet Take 81 mg by mouth daily.  . calcium carbonate (OS-CAL) 600 MG TABS tablet Take 600 mg by mouth daily with breakfast. Takes sporadically  . Fish Oil OIL 1 capsule by Does not apply route daily.  . hydrochlorothiazide (HYDRODIURIL) 12.5 MG tablet Take 12.5 mg by mouth daily.  Marland Kitchen HYDROcodone-acetaminophen (NORCO/VICODIN) 5-325 MG tablet Take 1 tablet by mouth every 4 (four) hours as needed for moderate pain (Must last 14 days.Do not take and drive a car or use machinery.).  Marland Kitchen levothyroxine (SYNTHROID, LEVOTHROID) 50 MCG tablet Take 50 mcg by mouth daily before breakfast.  . Multiple Vitamin (MULTIVITAMIN) tablet Take 1 tablet by mouth daily. Takes sporadically     Allergies:   Patient has no known allergies.   Social History   Socioeconomic History  . Marital status: Married    Spouse name: Not on file  . Number of children: Not on file  . Years of education: Not on file  . Highest education level: Not on file  Occupational History  . Not on file  Tobacco Use  . Smoking status: Never Smoker  . Smokeless tobacco: Never Used  Substance and Sexual Activity  . Alcohol use: No    Alcohol/week: 0.0 standard drinks  . Drug use: No  . Sexual activity:  Not on file  Other Topics Concern  . Not on file  Social History Narrative  . Not on file   Social Determinants of Health   Financial Resource Strain: Not on file  Food Insecurity: Not on file  Transportation Needs: Not on file  Physical Activity: Not on file  Stress: Not on file  Social Connections: Not on file     Family History: The patient'sfamily history is negative for Colon cancer.  ROS:   Please see the history of present illness.    All other systems reviewed and are negative.  EKGs/Labs/Other Studies Reviewed:    EKG:   04/26/21-Sinus bradycardia, Rate 58, septal Q waves. Prior EKG 04/21/21: Sinus bradycardia, Rate 54, septal Q waves.  Recent Labs: No results found for requested labs  within last 8760 hours.  Recent Lipid Panel No results found for: CHOL, TRIG, HDL, CHOLHDL, VLDL, LDLCALC, LDLDIRECT   Risk Assessment/Calculations:      Physical Exam:    VS:  BP 112/80 (BP Location: Left Arm, Patient Position: Sitting, Cuff Size: Normal)   Pulse (!) 58   Ht 5\' 6"  (1.676 m)   Wt 268 lb (121.6 kg)   SpO2 98%   BMI 43.26 kg/m     Wt Readings from Last 3 Encounters:  04/26/21 268 lb (121.6 kg)  02/21/16 257 lb 9.6 oz (116.8 kg)  01/03/15 259 lb (117.5 kg)     GEN:  Well nourished, well developed in no acute distress HEENT: Normal NECK: No JVD; No carotid bruits LYMPHATICS: No lymphadenopathy CARDIAC: Bradycardia, no murmurs, rubs, gallops RESPIRATORY:  Clear to auscultation without rales, wheezing or rhonchi  ABDOMEN: Soft, non-tender, non-distended MUSCULOSKELETAL:  No edema; No deformity  SKIN: Warm and dry NEUROLOGIC:  Alert and oriented x 3 PSYCHIATRIC:  Normal affect   ASSESSMENT:    1. Abnormal EKG   2. Chest discomfort    PLAN:    In order of problems listed above:  Abnormal EKG - Septal Q waves noted, poor R wave progression.  Sometimes this is a normal variant.  We will check an echocardiogram to ensure proper structure and function of her heart.  Obviously if there are any abnormalities, we will pursue further testing.  If echocardiogram shows normal structure and function, this is a normal variant on ECG.  Chest discomfort - Sharp substernal pain relieved with cold water lasting a few seconds duration.  Likely esophageal spasm/GI related.  Obviously if this symptom becomes worse or more worrisome, we can always pursue further testing, i.e. cardiac CT for instance.  Blood pressure today is under excellent control 112/80.  Continue with HCTZ 12.5.  Also, lipids look good with LDL 114 HDL 75 triglycerides 68.  Hemoglobin A1c was 6.1 prediabetic.  She is working on diet, exercise.  We will see her back as needed.  We will follow-up with  results of echocardiogram.  Obviously if symptoms become worse, she will let me know and we can always pursue further testing.     Medication Adjustments/Labs and Tests Ordered: Current medicines are reviewed at length with the patient today.  Concerns regarding medicines are outlined above.  Orders Placed This Encounter  Procedures  . EKG 12-Lead  . ECHOCARDIOGRAM COMPLETE   No orders of the defined types were placed in this encounter.   Patient Instructions  Medication Instructions:  The current medical regimen is effective;  continue present plan and medications.  *If you need a refill on your cardiac medications before your next  appointment, please call your pharmacy*  Testing/Procedures: Your physician has requested that you have an echocardiogram. Echocardiography is a painless test that uses sound waves to create images of your heart. It provides your doctor with information about the size and shape of your heart and how well your heart's chambers and valves are working. This procedure takes approximately one hour. There are no restrictions for this procedure.  Follow-Up: At [facility], you and your health needs are our priority.  As part of our continuing mission to provide you with exceptional heart care, we have created designated Provider Care Teams.  These Care Teams include your primary Cardiologist (physician) and Advanced Practice Providers (APPs -  Physician Assistants and Nurse Practitioners) who all work together to provide you with the care you need, when you need it.  We recommend signing up for the patient portal called "MyChart".  Sign up information is provided on this After Visit Summary.  MyChart is used to connect with patients for Virtual Visits (Telemedicine).  Patients are able to view lab/test results, encounter notes, upcoming appointments, etc.  Non-urgent messages can be sent to your provider as well.   To learn more about what you can do with MyChart,  go to [email or web address].    Your next appointment:   Follow up with Dr Anne Fu as needed.  Thank you for choosing [facility]!!          I,Alexis Bryant,acting as a scribe for [employer], MD.,have documented all relevant documentation on the behalf of Donato Schultz, MD,as directed by  Donato Schultz, MD while in the presence of Donato Schultz, MD.  I, Donato Schultz, MD, have reviewed all documentation for this visit. The documentation on 04/26/21 for the exam, diagnosis, procedures, and orders are all accurate and complete. I did the provider thank Signed, Donato Schultz, MD  04/26/2021 11:33 AM    [facility]

## 2021-04-26 NOTE — Patient Instructions (Signed)
Medication Instructions:  The current medical regimen is effective;  continue present plan and medications.  *If you need a refill on your cardiac medications before your next appointment, please call your pharmacy*  Testing/Procedures: Your physician has requested that you have an echocardiogram. Echocardiography is a painless test that uses sound waves to create images of your heart. It provides your doctor with information about the size and shape of your heart and how well your heart's chambers and valves are working. This procedure takes approximately one hour. There are no restrictions for this procedure.  Follow-Up: At [facility], you and your health needs are our priority.  As part of our continuing mission to provide you with exceptional heart care, we have created designated Provider Care Teams.  These Care Teams include your primary Cardiologist (physician) and Advanced Practice Providers (APPs -  Physician Assistants and Nurse Practitioners) who all work together to provide you with the care you need, when you need it.  We recommend signing up for the patient portal called "MyChart".  Sign up information is provided on this After Visit Summary.  MyChart is used to connect with patients for Virtual Visits (Telemedicine).  Patients are able to view lab/test results, encounter notes, upcoming appointments, etc.  Non-urgent messages can be sent to your provider as well.   To learn more about what you can do with MyChart, go to [email or web address].    Your next appointment:   Follow up with Dr Anne Fu as needed.  Thank you for choosing [facility]!!

## 2021-05-08 DIAGNOSIS — Z01419 Encounter for gynecological examination (general) (routine) without abnormal findings: Secondary | ICD-10-CM | POA: Diagnosis not present

## 2021-05-08 DIAGNOSIS — Z1231 Encounter for screening mammogram for malignant neoplasm of breast: Secondary | ICD-10-CM | POA: Diagnosis not present

## 2021-05-08 DIAGNOSIS — Z6841 Body Mass Index (BMI) 40.0 and over, adult: Secondary | ICD-10-CM | POA: Diagnosis not present

## 2021-05-08 DIAGNOSIS — Z124 Encounter for screening for malignant neoplasm of cervix: Secondary | ICD-10-CM | POA: Diagnosis not present

## 2021-05-08 DIAGNOSIS — H40053 Ocular hypertension, bilateral: Secondary | ICD-10-CM | POA: Diagnosis not present

## 2021-05-18 DIAGNOSIS — M1712 Unilateral primary osteoarthritis, left knee: Secondary | ICD-10-CM | POA: Diagnosis not present

## 2021-06-01 ENCOUNTER — Ambulatory Visit (HOSPITAL_COMMUNITY): Payer: BC Managed Care – PPO | Attending: Cardiology

## 2021-06-01 ENCOUNTER — Other Ambulatory Visit: Payer: Self-pay

## 2021-06-01 DIAGNOSIS — R9431 Abnormal electrocardiogram [ECG] [EKG]: Secondary | ICD-10-CM | POA: Diagnosis not present

## 2021-06-01 LAB — ECHOCARDIOGRAM COMPLETE
Area-P 1/2: 1.94 cm2
S' Lateral: 3.2 cm

## 2021-06-05 ENCOUNTER — Telehealth: Payer: Self-pay | Admitting: Cardiology

## 2021-06-05 NOTE — Telephone Encounter (Signed)
Spoke with pt and reviewed echo results.  Pt verbalized understanding and was appreciative for call.

## 2021-06-05 NOTE — Telephone Encounter (Signed)
PT is returning a call 

## 2021-07-04 DIAGNOSIS — N939 Abnormal uterine and vaginal bleeding, unspecified: Secondary | ICD-10-CM | POA: Diagnosis not present

## 2021-07-04 DIAGNOSIS — N950 Postmenopausal bleeding: Secondary | ICD-10-CM | POA: Diagnosis not present

## 2021-07-04 DIAGNOSIS — Z6841 Body Mass Index (BMI) 40.0 and over, adult: Secondary | ICD-10-CM | POA: Diagnosis not present

## 2021-09-28 DIAGNOSIS — I10 Essential (primary) hypertension: Secondary | ICD-10-CM | POA: Diagnosis not present

## 2021-09-28 DIAGNOSIS — E0590 Thyrotoxicosis, unspecified without thyrotoxic crisis or storm: Secondary | ICD-10-CM | POA: Diagnosis not present

## 2021-10-04 DIAGNOSIS — R600 Localized edema: Secondary | ICD-10-CM | POA: Diagnosis not present

## 2021-10-04 DIAGNOSIS — R030 Elevated blood-pressure reading, without diagnosis of hypertension: Secondary | ICD-10-CM | POA: Diagnosis not present

## 2021-10-04 DIAGNOSIS — E039 Hypothyroidism, unspecified: Secondary | ICD-10-CM | POA: Diagnosis not present

## 2021-10-04 DIAGNOSIS — K7689 Other specified diseases of liver: Secondary | ICD-10-CM | POA: Diagnosis not present

## 2021-10-04 DIAGNOSIS — Z23 Encounter for immunization: Secondary | ICD-10-CM | POA: Diagnosis not present

## 2022-01-05 DIAGNOSIS — H40011 Open angle with borderline findings, low risk, right eye: Secondary | ICD-10-CM | POA: Diagnosis not present

## 2022-01-09 DIAGNOSIS — L820 Inflamed seborrheic keratosis: Secondary | ICD-10-CM | POA: Diagnosis not present

## 2022-03-28 DIAGNOSIS — E782 Mixed hyperlipidemia: Secondary | ICD-10-CM | POA: Diagnosis not present

## 2022-03-28 DIAGNOSIS — R7303 Prediabetes: Secondary | ICD-10-CM | POA: Diagnosis not present

## 2022-04-09 DIAGNOSIS — K7689 Other specified diseases of liver: Secondary | ICD-10-CM | POA: Diagnosis not present

## 2022-04-09 DIAGNOSIS — R030 Elevated blood-pressure reading, without diagnosis of hypertension: Secondary | ICD-10-CM | POA: Diagnosis not present

## 2022-04-09 DIAGNOSIS — E039 Hypothyroidism, unspecified: Secondary | ICD-10-CM | POA: Diagnosis not present

## 2022-04-09 DIAGNOSIS — R600 Localized edema: Secondary | ICD-10-CM | POA: Diagnosis not present

## 2022-05-15 DIAGNOSIS — H40052 Ocular hypertension, left eye: Secondary | ICD-10-CM | POA: Diagnosis not present

## 2022-06-11 DIAGNOSIS — Z01419 Encounter for gynecological examination (general) (routine) without abnormal findings: Secondary | ICD-10-CM | POA: Diagnosis not present

## 2022-06-11 DIAGNOSIS — Z6841 Body Mass Index (BMI) 40.0 and over, adult: Secondary | ICD-10-CM | POA: Diagnosis not present

## 2022-06-11 DIAGNOSIS — Z124 Encounter for screening for malignant neoplasm of cervix: Secondary | ICD-10-CM | POA: Diagnosis not present

## 2022-06-11 DIAGNOSIS — Z1231 Encounter for screening mammogram for malignant neoplasm of breast: Secondary | ICD-10-CM | POA: Diagnosis not present

## 2022-08-16 DIAGNOSIS — M170 Bilateral primary osteoarthritis of knee: Secondary | ICD-10-CM | POA: Diagnosis not present

## 2022-09-13 DIAGNOSIS — N950 Postmenopausal bleeding: Secondary | ICD-10-CM | POA: Diagnosis not present

## 2022-09-13 DIAGNOSIS — Z6841 Body Mass Index (BMI) 40.0 and over, adult: Secondary | ICD-10-CM | POA: Diagnosis not present

## 2022-09-17 DIAGNOSIS — N939 Abnormal uterine and vaginal bleeding, unspecified: Secondary | ICD-10-CM | POA: Diagnosis not present

## 2022-09-17 DIAGNOSIS — Z23 Encounter for immunization: Secondary | ICD-10-CM | POA: Diagnosis not present

## 2022-10-01 DIAGNOSIS — E039 Hypothyroidism, unspecified: Secondary | ICD-10-CM | POA: Diagnosis not present

## 2022-10-01 DIAGNOSIS — R7303 Prediabetes: Secondary | ICD-10-CM | POA: Diagnosis not present

## 2022-10-01 DIAGNOSIS — E782 Mixed hyperlipidemia: Secondary | ICD-10-CM | POA: Diagnosis not present

## 2022-10-10 DIAGNOSIS — R600 Localized edema: Secondary | ICD-10-CM | POA: Diagnosis not present

## 2022-10-10 DIAGNOSIS — Z0000 Encounter for general adult medical examination without abnormal findings: Secondary | ICD-10-CM | POA: Diagnosis not present

## 2022-10-10 DIAGNOSIS — E039 Hypothyroidism, unspecified: Secondary | ICD-10-CM | POA: Diagnosis not present

## 2022-10-10 DIAGNOSIS — R030 Elevated blood-pressure reading, without diagnosis of hypertension: Secondary | ICD-10-CM | POA: Diagnosis not present

## 2022-10-10 DIAGNOSIS — Z23 Encounter for immunization: Secondary | ICD-10-CM | POA: Diagnosis not present

## 2022-11-21 DIAGNOSIS — M170 Bilateral primary osteoarthritis of knee: Secondary | ICD-10-CM | POA: Diagnosis not present

## 2022-12-03 ENCOUNTER — Encounter (HOSPITAL_BASED_OUTPATIENT_CLINIC_OR_DEPARTMENT_OTHER): Payer: Self-pay | Admitting: Obstetrics and Gynecology

## 2022-12-03 ENCOUNTER — Telehealth: Payer: Self-pay | Admitting: Cardiology

## 2022-12-03 ENCOUNTER — Other Ambulatory Visit: Payer: Self-pay

## 2022-12-03 NOTE — Telephone Encounter (Signed)
I tried to call the pt again as it is after 4 pm now and I do not see that she has called back to confirm if she is keeping the appt I set for her for pre op clearance. See previous notes. No answer on pt's phone.   I called her daughter (DPR), though no answer on her phone as well.

## 2022-12-03 NOTE — Telephone Encounter (Signed)
   Name: JERSEY ESPINOZA  DOB: Sep 13, 1956  MRN: [id number]  Primary Cardiologist: Donato Schultz, MD  Chart reviewed as part of pre-operative protocol coverage. Because of Ashlynne Shetterly Hornig's past medical history and time since last visit, she will require a follow-up in-office visit in order to better assess preoperative cardiovascular risk. Last seen >1 year ago.  Pre-op covering staff: - Please schedule appointment and call patient to inform them. If patient already had an upcoming appointment within acceptable timeframe, please add "pre-op clearance" to the appointment notes so provider is aware. - Please contact requesting surgeon's office via preferred method (i.e, phone, fax) to inform them of need for appointment prior to surgery.   Alver Sorrow, NP  12/03/2022, 1:21 PM

## 2022-12-03 NOTE — Progress Notes (Addendum)
Spoke w/ via phone for pre-op interview---Karina Sawyer needs dos---- ISTAT, EKG, CBC, type & screen              Sawyer results------06/01/21 Echocardiogram in Epic, 04/26/21 EKG in chart for comparison. COVID test -----patient states asymptomatic no test needed Arrive at -------1200 on  Wednesday,12/05/22 NPO after MN NO Solid Food.  Clear liquids from MN until---1100 Med rec completed Medications to take morning of surgery -----Synthroid  Patient stated that Dr. Timothy Lasso advised her to stop ASA 81 mg and fish oil on 11/30/22 prior to surgery. Diabetic medication -----n/a Patient instructed no nail polish to be worn day of surgery Patient instructed to bring photo id and insurance card day of surgery Patient aware to have Driver (ride ) / caregiver    for 24 hours after surgery - husband, Karina Sawyer Patient Special Instructions -----none Pre-Op special Istructions -----none Patient verbalized understanding of instructions that were given at this phone interview. Patient denies shortness of breath, chest pain, fever, cough at this phone interview.   Patient has a pre-op cardiology appointment to obtain cardiac clearance on 12/04/22 @ 1315. Patient had an echocardiogram on 06/01/21 that showed left atrial size severely dilated. LOV with cardiologist, Dr. Caro Hight was 04/26/21 in Epic. Reviewed case with Dr. Chilton Si, MDA. Per Dr. Chilton Si, patient needs cardiac clearance prior to surgery.

## 2022-12-03 NOTE — Telephone Encounter (Signed)
   Pre-operative Risk Assessment    Patient Name: Karina Sawyer  DOB: [birth year] MRN: [id number]      Request for Surgical Clearance    Procedure:  D&C hysteroscopy for post menopausal bleeding  Date of Surgery:  Clearance 12/05/22                                 Surgeon:  Dr. Timothy Lasso  Surgeon's Group or Practice Name:  [facility] Phone number:  [phone] Fax number:  [phone]   Type of Clearance Requested:   - Medical    Type of Anesthesia:  Choice   Additional requests/questions:    SignedDamaris Schooner   12/03/2022, 12:08 PM

## 2022-12-03 NOTE — Telephone Encounter (Signed)
I will update the requesting office that the pt will be seen tomorrow for pre op clearance. Pt will be assessed for pre op clearance by Jacolyn Reedy, PAC. Once the pt has been cleared PAC will have her nurse/cma fax over the clearance notes any any recommendations.

## 2022-12-03 NOTE — Progress Notes (Unsigned)
Cardiology Office Note:    Date:  12/04/2022   ID:  Karina Sawyer, DOB December 13, 1956, MRN [id number]  PCP:  Karina Stabile, MD  [facility] Providers Cardiologist:  Karina Schultz, MD     Referring MD: Karina Stabile, MD   Chief Complaint:  Pre-op Exam     History of Present Illness:   Karina Sawyer is a 66 y.o. female with history of HTN, HLD, hypothyroidism. She saw Dr. Anne Sawyer 04/2021 with atypical chest pain sharp and relieved with cold water,  and abnormal EKG with septal Q waves and poor R wave progression. Echo was normal and told to f/u prn.     Patient on my schedule today for preop clearance for surgery tomorrow.   Patient denies any further chest pain, dyspnea, dizzness or presyncope. Was given med for acid reflux and symptoms resolved. 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 Has chronic lymphedema. Trying to lose weight by cutting out sweets.       Past Medical History:  Diagnosis Date   Abnormal results of liver function studies    Acute maxillary sinusitis    around 2003   Arthritis    knees   Chest pain    Chest pain 2022   Per pt, she experienced a burning sensation in her chest and an abnormal EKG dated 04/26/21. She was sent to Dr. Anne Sawyer, cardiologist on 04/26/21. Echocardiogram demonstrated LVEF  55 - 60% and severe left atrial dilation. Patient states that she was told everything was fine, and that her chest pain was likely acid reflux.   Heart murmur    many years ago told had a murmur but no one recenctly mentioned   History of kidney stones 2003   Hyperlipidemia    Hypertension    Follows w/ PCP, Dr. Catalina Sawyer takes HCTZ.   Hypothyroidism    Follows with PCP, Dr. Catalina Sawyer. Currently taking Synthroid.   Menopausal and female climacteric states    Morbid obesity (HCC)    PONV (postoperative nausea and vomiting)    Patient states severe nausea and vomiting after anesthesia.   Wears glasses    reading only   Current  Medications: Current Meds  Medication Sig   aspirin 81 MG tablet Take 81 mg by mouth daily. Pt stated she stopped taking ASA 81 mg on Friday 11/30/22 for 12/05/22 surgery.   calcium carbonate (OS-CAL) 600 MG TABS tablet Take 600 mg by mouth daily with breakfast. Takes sporadically   Fish Oil OIL 1 capsule by Does not apply route daily. Patient stated she stopped taking Fish Oil on 11/30/22 due to 12/05/22 surgery.   hydrochlorothiazide (HYDRODIURIL) 12.5 MG tablet Take 12.5 mg by mouth daily.   levothyroxine (SYNTHROID, LEVOTHROID) 50 MCG tablet Take 50 mcg by mouth daily before breakfast.    Allergies:   Patient has no known allergies.   Social History   Tobacco Use   Smoking status: Never   Smokeless tobacco: Never  Vaping Use   Vaping Use: Never used  Substance Use Topics   Alcohol use: No    Alcohol/week: 0.0 standard drinks of alcohol   Drug use: No    Family Hx: The patient's family history is negative for Colon cancer.  ROS     Physical Exam:    VS:  BP 126/88   Pulse 66   Ht 5\' 7"  (1.702 m)   Wt 261 lb 9.6 oz (118.7 kg)   SpO2  96%   BMI 40.97 kg/m     Wt Readings from Last 3 Encounters:  12/04/22 261 lb 9.6 oz (118.7 kg)  04/26/21 268 lb (121.6 kg)  02/21/16 257 lb 9.6 oz (116.8 kg)    Physical Exam  GEN: Obese, in no acute distress  Neck: no JVD, carotid bruits, or masses Cardiac:RRR; no murmurs, rubs, or gallops  Respiratory:  clear to auscultation bilaterally, normal work of breathing GI: soft, nontender, nondistended, + BS Ext: chronic lymphedema, Good distal pulses bilaterally Neuro:  Alert and Oriented x 3,  Psych: euthymic mood, full affect        EKGs/Labs/Other Test Reviewed:    EKG:  EKG is   ordered today.  The ekg ordered today demonstrates NSR poor ant R wave progression-no changes  Recent Labs: No results found for requested labs within last 365 days.   Recent Lipid Panel No results for input(s): "CHOL", "TRIG", "HDL", "VLDL",  "LDLCALC", "LDLDIRECT" in the last 8760 hours.   Prior CV Studies:   Echo 05/2021 IMPRESSIONS     1. Left ventricular ejection fraction, by estimation, is 55 to 60%. The  left ventricle has normal function. The left ventricle has no regional  wall motion abnormalities. Left ventricular diastolic parameters were  normal.   2. Right ventricular systolic function is normal. The right ventricular  size is mildly enlarged. There is normal pulmonary artery systolic  pressure. The estimated right ventricular systolic pressure is 21.1 mmHg.   3. Left atrial size was severely dilated.   4. Right atrial size was mildly dilated.   5. The mitral valve is normal in structure. Trivial mitral valve  regurgitation.   6. The aortic valve is tricuspid. Aortic valve regurgitation is not  visualized. No aortic stenosis is present.   7. The inferior vena cava is normal in size with greater than 50%  respiratory variability, suggesting right atrial pressure of 3 mmHg.   FINDINGS   Left Ventricle: Left ventricular ejection fraction, by estimation, is 55  to 60%. The left ventricle has normal function. The left ventricle has no  regional wall motion abnormalities. The left ventricular internal cavity  size was normal in size. There is   no left ventricular hypertrophy. Left ventricular diastolic parameters  were normal.   Right Ventricle: The right ventricular size is mildly enlarged. No  increase in right ventricular wall thickness. Right ventricular systolic  function is normal. There is normal pulmonary artery systolic pressure.  The tricuspid regurgitant velocity is 2.13   m/s, and with an assumed right atrial pressure of 3 mmHg, the estimated  right ventricular systolic pressure is 21.1 mmHg.   Left Atrium: Left atrial size was severely dilated.   Right Atrium: Right atrial size was mildly dilated.   Pericardium: There is no evidence of pericardial effusion.   Mitral Valve: The mitral valve  is normal in structure. Trivial mitral  valve regurgitation.   Tricuspid Valve: The tricuspid valve is normal in structure. Tricuspid  valve regurgitation is not demonstrated.   Aortic Valve: The aortic valve is tricuspid. Aortic valve regurgitation is  not visualized. No aortic stenosis is present.   Pulmonic Valve: The pulmonic valve was not well visualized. Pulmonic valve  regurgitation is not visualized.   Aorta: The aortic root and ascending aorta are structurally normal, with  no evidence of dilitation.   Venous: The inferior vena cava is normal in size with greater than 50%  respiratory variability, suggesting right atrial pressure of 3 mmHg.  IAS/Shunts: The interatrial septum was not well visualized.       Risk Assessment/Calculations/Metrics:              ASSESSMENT & PLAN:   No problem-specific Assessment & Plan notes found for this encounter.   Preop clearance for D&C hysteroscopy for post menopausal bleeding 12/05/22 Dr. Romilda Joy with history of atypical chest pain felt to be GERD. No further chest pain since treated with omeprazole. She can proceed with above surgery without further work up.  According to the Revised Cardiac Risk Index (RCRI), her Perioperative Risk of Major Cardiac Event is (%): 0.4  Her Functional Capacity in METs is: 6.61 according to the Duke Activity Status Index (DASI).   Abnormal EKG with septal Q waves and  poor R wave progression, normal echo-asymptomatic  HTN controlled with HCTZ  HLD LDL 99 on fish oil per PCP  Lymphedema-wears compression stockings  Obesity-trying to lose weight by cutting out sugar            Dispo:  No follow-ups on file.   Medication Adjustments/Labs and Tests Ordered: Current medicines are reviewed at length with the patient today.  Concerns regarding medicines are outlined above.  Tests Ordered: Orders Placed This Encounter  Procedures   EKG 12-Lead   Medication Changes: No orders  of the defined types were placed in this encounter.  Signed, Jacolyn Reedy, PA-C  12/04/2022 1:44 PM    [facility] [address] [place], [place], [place]  [address] Phone: [phone]; Fax: [phone]

## 2022-12-03 NOTE — Telephone Encounter (Signed)
I called the requesting office to let them know the pt is going to need an appt in office before she can be cleared. Pt last seen 04/2021.    I then left a message for the pt that she needs an in office appt for pre op clearance. I have tentatively held 12/04/22 @ 1:15 with Jacolyn Reedy, PAC. Need pt to call back to confirm appt. I will send FYI to surgeon office as well.

## 2022-12-03 NOTE — Progress Notes (Signed)
Patient has a hx of a heart murmur. 06/01/21 echocardiogram showed EF 55 -60% with left atrial severely dilated. Per Dr. Chilton Si, MDA, patient needs cardiac clearance prior to surgery. I left a message with the scheduler at Dr. Casper Harrison office @ [phone] ext. [phone] on 12/03/22 letting her know that cardiac clearance was needed.

## 2022-12-03 NOTE — Telephone Encounter (Signed)
Pt called back to confirm that tomorrow at 1:15pm works for her, she will be there.

## 2022-12-04 ENCOUNTER — Ambulatory Visit: Payer: BC Managed Care – PPO | Attending: Physician Assistant | Admitting: Physician Assistant

## 2022-12-04 ENCOUNTER — Encounter: Payer: Self-pay | Admitting: Physician Assistant

## 2022-12-04 VITALS — BP 126/88 | HR 66 | Ht 67.0 in | Wt 261.6 lb

## 2022-12-04 DIAGNOSIS — E785 Hyperlipidemia, unspecified: Secondary | ICD-10-CM | POA: Diagnosis not present

## 2022-12-04 DIAGNOSIS — Z01818 Encounter for other preprocedural examination: Secondary | ICD-10-CM | POA: Diagnosis not present

## 2022-12-04 DIAGNOSIS — I10 Essential (primary) hypertension: Secondary | ICD-10-CM

## 2022-12-04 DIAGNOSIS — R9431 Abnormal electrocardiogram [ECG] [EKG]: Secondary | ICD-10-CM

## 2022-12-04 DIAGNOSIS — I890 Lymphedema, not elsewhere classified: Secondary | ICD-10-CM

## 2022-12-04 NOTE — Progress Notes (Signed)
Patient received cardiac clearance on 12/04/22 from Jacolyn Reedy, [place]. OV note dated 12/04/22 in Epic giving clearance. Copy of clearance put in patient's chart for 12/05/22 surgery.

## 2022-12-04 NOTE — Patient Instructions (Signed)
Medication Instructions:  Your physician recommends that you continue on your current medications as directed. Please refer to the Current Medication list given to you today.  *If you need a refill on your cardiac medications before your next appointment, please call your pharmacy*   Lab Work: None ordered   If you have labs (blood work) drawn today and your tests are completely normal, you will receive your results only by: MyChart Message (if you have MyChart) OR A paper copy in the mail If you have any lab test that is abnormal or we need to change your treatment, we will call you to review the results.   Testing/Procedures: None ordered    Follow-Up: You can follow up with our office as needed  Other Instructions   Important Information About Sugar

## 2022-12-04 NOTE — H&P (Signed)
Karina Sawyer is an 66 y.o. female P1 with recurrent postmenopausal bleeding.  06/21/21 period like bleeding 07/04/21 TVUS: 6.2cm anteverted uterus, EMS 7.66mm heterogeneous with hypoechoic areas and echogenic foci, left ovary normal (2.43cm), right ovary not visualized. Suspect endometrial polyps and surrounding fluid.  07/04/21 EMB: benign 08/27/22-09/07/22 bright red bleeding     Menstrual History:  No LMP recorded. Patient is postmenopausal.    Past Medical History:  Diagnosis Date   Abnormal results of liver function studies    Acute maxillary sinusitis    around 2003   Arthritis    knees   Chest pain    Chest pain 2022   Per pt, she experienced a burning sensation in her chest and an abnormal EKG dated 04/26/21. She was sent to Dr. Anne Fu, cardiologist on 04/26/21. Echocardiogram demonstrated LVEF  55 - 60% and severe left atrial dilation. Patient states that she was told everything was fine, and that her chest pain was likely acid reflux.   Heart murmur    many years ago told had a murmur but no one recenctly mentioned   History of kidney stones 2003   Hyperlipidemia    Hypertension    Follows w/ PCP, Dr. Catalina Pizza takes HCTZ.   Hypothyroidism    Follows with PCP, Dr. Catalina Pizza. Currently taking Synthroid.   Menopausal and female climacteric states    Morbid obesity (HCC)    PONV (postoperative nausea and vomiting)    Patient states severe nausea and vomiting after anesthesia.   Wears glasses    reading only    Past Surgical History:  Procedure Laterality Date   APPENDECTOMY  1971   COLONOSCOPY  2015   [facility] - no polyps per pt   NECK SURGERY     growth removed at birth   ovary removed     age 8   TUBAL LIGATION  08/24/1989   early 57's   WISDOM TOOTH EXTRACTION     many years ago    Family History  Problem Relation Age of Onset   Colon cancer Neg Hx     Social History:  reports that she has never smoked. She has never used smokeless tobacco. She reports  that she does not drink alcohol and does not use drugs.  Allergies: No Known Allergies  No medications prior to admission.    Review of Systems  Constitutional:  Negative for fever.  HENT:  Negative for sore throat.   Eyes:  Negative for visual disturbance.  Respiratory:  Negative for shortness of breath.   Cardiovascular:  Negative for chest pain.  Gastrointestinal:  Negative for abdominal pain.  Genitourinary:  Positive for vaginal bleeding.  Musculoskeletal:  Negative for arthralgias.  Skin:  Negative for rash.  Neurological:  Negative for headaches.  Psychiatric/Behavioral:  Negative for suicidal ideas.     Height 5\' 7"  (1.702 m), weight 117.9 kg. Physical Exam  Chaperone Chaperone: present  Constitutional General Appearance: healthy-appearing, overweight  Psychiatric Orientation: to time, to place, to person Mood and Affect: active and alert, normal mood  Skin Appearance: no rashes, no lesions  Neck Neck: supple, no masses Thyroid: no enlargement, no nodules, non-tender  Lungs Auscultation: clear to auscultation, no wheezing, no rales/crackles, no rhonchi  Cardiovascular Auscultation: RRR, no murmur  Abdomen Auscultation/Inspection/Palpation: soft, non-distended, no tenderness, no hepatomegaly, no splenomegaly, no masses Hernia: none palpated  Breast Inspection/Palpation: no skin changes, nipple appearance normal, no tenderness, no distinct masses  Female Genitalia Vulva: no masses, no  lesions, vulvar atrophy Vagina: no tenderness, no abnormal vaginal discharge, no cystocele, no rectocele Cervix: grossly normal, no cervical motion tenderness Uterus: normal size, normal shape, midline, no uterine prolapse, non-tender Bladder/Urethra: normal meatus, no urethral discharge Adnexa/Parametria: no parametrial tenderness, no adnexal tenderness, no ovarian mass  Lymph Nodes Palpation: non tender submandibular nodes, non tender axillary nodes, non tender  inguinal nodes  No results found for this or any previous visit (from the past 74 hour(s)).  No results found.  Assessment/Plan: 66Y P1 with recurrent postmenopausal bleeding - Plan: hysteroscopy, D&C - Informed consent obtained. isk of infection, bleeding, uterine perforation, laparoscopy, failure to complete procedure, failure to obtain desired result. All questions answered.  - Preop clearance provided by PCP and cardiology.  Rowland Lathe 12/04/2022, 9:04 PM

## 2022-12-04 NOTE — Anesthesia Preprocedure Evaluation (Signed)
Anesthesia Evaluation  Patient identified by MRN, date of birth, ID band Patient awake    Reviewed: Allergy & Precautions, NPO status , Patient's Chart, lab work & pertinent test results  History of Anesthesia Complications (+) PONV  Airway Mallampati: II  TM Distance: >3 FB Neck ROM: Full    Dental no notable dental hx. (+) Teeth Intact, Dental Advisory Given   Pulmonary pneumonia   Pulmonary exam normal breath sounds clear to auscultation       Cardiovascular hypertension, Normal cardiovascular exam Rhythm:Regular Rate:Normal  05/2021 Echo 1. Left ventricular ejection fraction, by estimation, is 55 to 60%. The  left ventricle has normal function. The left ventricle has no regional  wall motion abnormalities. Left ventricular diastolic parameters were  normal.   2. Right ventricular systolic function is normal. The right ventricular  size is mildly enlarged. There is normal pulmonary artery systolic  pressure. The estimated right ventricular systolic pressure is 21.1 mmHg.   3. Left atrial size was severely dilated.   4. Right atrial size was mildly dilated.   5. The mitral valve is normal in structure. Trivial mitral valve  regurgitation.   6. The aortic valve is tricuspid. Aortic valve regurgitation is not  visualized. No aortic stenosis is present.   7. The inferior vena cava is normal in size with greater than 50%  respiratory variability, suggesting right atrial pressure of 3 mmHg.     Neuro/Psych negative neurological ROS     GI/Hepatic negative GI ROS, Neg liver ROS,,,  Endo/Other  Hypothyroidism  Morbid obesity (BMI 41)  Renal/GU Lab Results      Component                Value               Date                      CREATININE               0.60                12/05/2022                BUN                      23                  12/05/2022                NA                       140                  12/05/2022                K                        3.5                 12/05/2022                CL                       102                 12/05/2022  Musculoskeletal  (+) Arthritis ,    Abdominal  (+) + obese (BMI 40.6)  Peds  Hematology Lab Results      Component                Value               Date                      HGB                      14.6                12/05/2022                HCT                      43.0                12/05/2022              Anesthesia Other Findings   Reproductive/Obstetrics                             Anesthesia Physical Anesthesia Plan  ASA: 3  Anesthesia Plan: General   Post-op Pain Management: Toradol IV (intra-op)* and Dilaudid IV   Induction: Intravenous  PONV Risk Score and Plan: 4 or greater and Treatment may vary due to age or medical condition, Midazolam, Dexamethasone, Ondansetron, Scopolamine patch - Pre-op, TIVA and Propofol infusion  Airway Management Planned: LMA  Additional Equipment: None  Intra-op Plan:   Post-operative Plan:   Informed Consent: I have reviewed the patients History and Physical, chart, labs and discussed the procedure including the risks, benefits and alternatives for the proposed anesthesia with the patient or authorized representative who has indicated his/her understanding and acceptance.     Dental advisory given  Plan Discussed with: CRNA, Anesthesiologist and Surgeon  Anesthesia Plan Comments: (TIVA LMA)        Anesthesia Quick Evaluation

## 2022-12-05 ENCOUNTER — Encounter (HOSPITAL_BASED_OUTPATIENT_CLINIC_OR_DEPARTMENT_OTHER): Payer: Self-pay | Admitting: Obstetrics and Gynecology

## 2022-12-05 ENCOUNTER — Other Ambulatory Visit: Payer: Self-pay

## 2022-12-05 ENCOUNTER — Encounter (HOSPITAL_BASED_OUTPATIENT_CLINIC_OR_DEPARTMENT_OTHER)
Admission: RE | Disposition: A | Discharge status: Home / Self Care | Payer: Self-pay | Source: Ambulatory Visit | Attending: Obstetrics and Gynecology

## 2022-12-05 ENCOUNTER — Ambulatory Visit (HOSPITAL_BASED_OUTPATIENT_CLINIC_OR_DEPARTMENT_OTHER): Payer: BC Managed Care – PPO | Admitting: Anesthesiology

## 2022-12-05 ENCOUNTER — Ambulatory Visit (HOSPITAL_BASED_OUTPATIENT_CLINIC_OR_DEPARTMENT_OTHER)
Admission: RE | Admit: 2022-12-05 | Discharge: 2022-12-05 | Disposition: A | Discharge status: Home / Self Care | Payer: BC Managed Care – PPO | Source: Ambulatory Visit | Attending: Obstetrics and Gynecology | Admitting: Obstetrics and Gynecology

## 2022-12-05 DIAGNOSIS — Z6841 Body Mass Index (BMI) 40.0 and over, adult: Secondary | ICD-10-CM | POA: Diagnosis not present

## 2022-12-05 DIAGNOSIS — I10 Essential (primary) hypertension: Secondary | ICD-10-CM | POA: Insufficient documentation

## 2022-12-05 DIAGNOSIS — J189 Pneumonia, unspecified organism: Secondary | ICD-10-CM | POA: Diagnosis not present

## 2022-12-05 DIAGNOSIS — Z01818 Encounter for other preprocedural examination: Secondary | ICD-10-CM

## 2022-12-05 DIAGNOSIS — N950 Postmenopausal bleeding: Secondary | ICD-10-CM | POA: Insufficient documentation

## 2022-12-05 DIAGNOSIS — N840 Polyp of corpus uteri: Secondary | ICD-10-CM | POA: Diagnosis not present

## 2022-12-05 DIAGNOSIS — D259 Leiomyoma of uterus, unspecified: Secondary | ICD-10-CM | POA: Diagnosis not present

## 2022-12-05 DIAGNOSIS — E039 Hypothyroidism, unspecified: Secondary | ICD-10-CM | POA: Diagnosis not present

## 2022-12-05 HISTORY — PX: DILATATION & CURETTAGE/HYSTEROSCOPY WITH MYOSURE: SHX6511

## 2022-12-05 HISTORY — DX: Essential (primary) hypertension: I10

## 2022-12-05 HISTORY — DX: Hypothyroidism, unspecified: E03.9

## 2022-12-05 HISTORY — DX: Presence of spectacles and contact lenses: Z97.3

## 2022-12-05 HISTORY — DX: Other specified postprocedural states: Z98.890

## 2022-12-05 LAB — TYPE AND SCREEN
ABO/RH(D): O NEG
Antibody Screen: NEGATIVE

## 2022-12-05 LAB — CBC
HCT: 43.8 % (ref 36.0–46.0)
Hemoglobin: 14 g/dL (ref 12.0–15.0)
MCH: 30 pg (ref 26.0–34.0)
MCHC: 32 g/dL (ref 30.0–36.0)
MCV: 93.8 fL (ref 80.0–100.0)
Platelets: 234 10*3/uL (ref 150–400)
RBC: 4.67 MIL/uL (ref 3.87–5.11)
RDW: 12.9 % (ref 11.5–15.5)
WBC: 6.6 10*3/uL (ref 4.0–10.5)
nRBC: 0 % (ref 0.0–0.2)

## 2022-12-05 LAB — POCT I-STAT, CHEM 8
BUN: 23 mg/dL (ref 8–23)
Calcium, Ion: 1.22 mmol/L (ref 1.15–1.40)
Chloride: 102 mmol/L (ref 98–111)
Creatinine, Ser: 0.6 mg/dL (ref 0.44–1.00)
Glucose, Bld: 98 mg/dL (ref 70–99)
HCT: 43 % (ref 36.0–46.0)
Hemoglobin: 14.6 g/dL (ref 12.0–15.0)
Potassium: 3.5 mmol/L (ref 3.5–5.1)
Sodium: 140 mmol/L (ref 135–145)
TCO2: 28 mmol/L (ref 22–32)

## 2022-12-05 LAB — ABO/RH: ABO/RH(D): O NEG

## 2022-12-05 SURGERY — DILATATION & CURETTAGE/HYSTEROSCOPY WITH MYOSURE
Anesthesia: General

## 2022-12-05 MED ORDER — LIDOCAINE HCL (PF) 2 % IJ SOLN
INTRAMUSCULAR | Status: AC
  Filled 2022-12-05: qty 5

## 2022-12-05 MED ORDER — OXYCODONE HCL 5 MG/5ML PO SOLN: 5.0000 mg | Freq: Once | ORAL | Status: DC | PRN

## 2022-12-05 MED ORDER — FENTANYL CITRATE (PF) 100 MCG/2ML IJ SOLN
INTRAMUSCULAR | Status: AC
  Filled 2022-12-05: qty 2

## 2022-12-05 MED ORDER — PROPOFOL 10 MG/ML IV BOLUS
INTRAVENOUS | Status: DC | PRN
  Administered 2022-12-05: 150 mg via INTRAVENOUS

## 2022-12-05 MED ORDER — KETOROLAC TROMETHAMINE 30 MG/ML IJ SOLN: 30.0000 mg | Freq: Once | INTRAMUSCULAR | Status: DC | PRN

## 2022-12-05 MED ORDER — KETOROLAC TROMETHAMINE 30 MG/ML IJ SOLN
INTRAMUSCULAR | Status: DC | PRN
  Administered 2022-12-05: 30 mg via INTRAVENOUS

## 2022-12-05 MED ORDER — KETOROLAC TROMETHAMINE 30 MG/ML IJ SOLN
INTRAMUSCULAR | Status: AC
  Filled 2022-12-05: qty 1

## 2022-12-05 MED ORDER — IBUPROFEN 200 MG PO TABS: 600.0000 mg | ORAL_TABLET | Freq: Four times a day (QID) | ORAL | Status: AC | PRN

## 2022-12-05 MED ORDER — ACETAMINOPHEN 325 MG PO TABS: 650.0000 mg | ORAL_TABLET | Freq: Four times a day (QID) | ORAL | Status: AC | PRN

## 2022-12-05 MED ORDER — ONDANSETRON HCL 4 MG/2ML IJ SOLN: 4.0000 mg | Freq: Once | INTRAMUSCULAR | Status: DC | PRN

## 2022-12-05 MED ORDER — LACTATED RINGERS IV SOLN: INTRAVENOUS | Status: DC

## 2022-12-05 MED ORDER — DEXMEDETOMIDINE HCL IN NACL 80 MCG/20ML IV SOLN
INTRAVENOUS | Status: DC | PRN
  Administered 2022-12-05 (×2): 8 ug via BUCCAL

## 2022-12-05 MED ORDER — MIDAZOLAM HCL 2 MG/2ML IJ SOLN
INTRAMUSCULAR | Status: AC
  Filled 2022-12-05: qty 2

## 2022-12-05 MED ORDER — DEXAMETHASONE SODIUM PHOSPHATE 10 MG/ML IJ SOLN
INTRAMUSCULAR | Status: DC | PRN
  Administered 2022-12-05: 5 mg via INTRAVENOUS

## 2022-12-05 MED ORDER — ACETAMINOPHEN 500 MG PO TABS
1000.0000 mg | ORAL_TABLET | ORAL | Status: AC
  Administered 2022-12-05: 1000 mg via ORAL

## 2022-12-05 MED ORDER — ONDANSETRON HCL 4 MG/2ML IJ SOLN
INTRAMUSCULAR | Status: AC
  Filled 2022-12-05: qty 2

## 2022-12-05 MED ORDER — FENTANYL CITRATE (PF) 100 MCG/2ML IJ SOLN
INTRAMUSCULAR | Status: DC | PRN
  Administered 2022-12-05 (×2): 25 ug via INTRAVENOUS
  Administered 2022-12-05: 50 ug via INTRAVENOUS

## 2022-12-05 MED ORDER — HYDROMORPHONE HCL 1 MG/ML IJ SOLN: 0.2500 mg | INTRAMUSCULAR | Status: DC | PRN

## 2022-12-05 MED ORDER — ACETAMINOPHEN 500 MG PO TABS
ORAL_TABLET | ORAL | Status: AC
  Filled 2022-12-05: qty 2

## 2022-12-05 MED ORDER — PROPOFOL 500 MG/50ML IV EMUL
INTRAVENOUS | Status: DC | PRN
  Administered 2022-12-05: 200 ug/kg/min via INTRAVENOUS

## 2022-12-05 MED ORDER — MIDAZOLAM HCL 2 MG/2ML IJ SOLN
INTRAMUSCULAR | Status: DC | PRN
  Administered 2022-12-05 (×2): 1 mg via INTRAVENOUS

## 2022-12-05 MED ORDER — DEXAMETHASONE SODIUM PHOSPHATE 10 MG/ML IJ SOLN
INTRAMUSCULAR | Status: AC
  Filled 2022-12-05: qty 1

## 2022-12-05 MED ORDER — OXYCODONE HCL 5 MG PO TABS: 5.0000 mg | ORAL_TABLET | Freq: Once | ORAL | Status: DC | PRN

## 2022-12-05 MED ORDER — SODIUM CHLORIDE 0.9 % IR SOLN
Status: DC | PRN
  Administered 2022-12-05: 3000 mL

## 2022-12-05 MED ORDER — ONDANSETRON HCL 4 MG/2ML IJ SOLN
INTRAMUSCULAR | Status: DC | PRN
  Administered 2022-12-05: 4 mg via INTRAVENOUS

## 2022-12-05 MED ORDER — WHITE PETROLATUM EX OINT
TOPICAL_OINTMENT | CUTANEOUS | Status: AC
  Filled 2022-12-05: qty 5

## 2022-12-05 MED ORDER — LIDOCAINE 2% (20 MG/ML) 5 ML SYRINGE
INTRAMUSCULAR | Status: DC | PRN
  Administered 2022-12-05: 60 mg via INTRAVENOUS

## 2022-12-05 MED ORDER — LIDOCAINE HCL 1 % IJ SOLN
INTRAMUSCULAR | Status: DC | PRN
  Administered 2022-12-05: 10 mL

## 2022-12-05 SURGICAL SUPPLY — 16 items
CATH ROBINSON RED A/P 16FR (CATHETERS) ×1 IMPLANT
DEVICE MYOSURE LITE (MISCELLANEOUS) IMPLANT
DEVICE MYOSURE REACH (MISCELLANEOUS) IMPLANT
DRSG TELFA 3X8 NADH STRL (GAUZE/BANDAGES/DRESSINGS) ×1 IMPLANT
GAUZE 4X4 16PLY ~~LOC~~+RFID DBL (SPONGE) ×1 IMPLANT
GLOVE BIO SURGEON STRL SZ 6 (GLOVE) ×1 IMPLANT
GLOVE BIOGEL PI IND STRL 6.5 (GLOVE) ×1 IMPLANT
GOWN STRL REUS W/ TWL LRG LVL3 (GOWN DISPOSABLE) ×1 IMPLANT
GOWN STRL REUS W/TWL LRG LVL3 (GOWN DISPOSABLE) ×1
KIT PROCEDURE FLUENT (KITS) ×1 IMPLANT
KIT TURNOVER CYSTO (KITS) ×1 IMPLANT
PACK VAGINAL MINOR WOMEN LF (CUSTOM PROCEDURE TRAY) ×1 IMPLANT
PAD OB MATERNITY 4.3X12.25 (PERSONAL CARE ITEMS) ×1 IMPLANT
SEAL ROD LENS SCOPE MYOSURE (ABLATOR) ×1 IMPLANT
TOWEL OR 17X26 10 PK STRL BLUE (TOWEL DISPOSABLE) ×1 IMPLANT
UNDERPAD 30X36 HEAVY ABSORB (UNDERPADS AND DIAPERS) ×1 IMPLANT

## 2022-12-05 NOTE — Op Note (Signed)
12/05/2022   1:29 PM   PATIENT:  Karina Sawyer  66 y.o. female   PRE-OPERATIVE DIAGNOSIS:  recurrent post menopausal bleeding   POST-OPERATIVE DIAGNOSIS:  recurrent post menopausal bleeding   PROCEDURE:  Hysteroscopy, polypectomy, dilation and curettage    SURGEON:  Surgeon(s) and Role:    * Jawana Reagor, Terrance Mass, MD - Primary   ANESTHESIA:   local and general   EBL:  5 mL    BLOOD ADMINISTERED:none   DRAINS: none    LOCAL MEDICATIONS USED:  LIDOCAINE  and Amount: 10 ml   SPECIMEN:  Source of Specimen:  endometrial polyp and curettings   DISPOSITION OF SPECIMEN:  PATHOLOGY   COUNTS:  YES   TOURNIQUET:  * No tourniquets in log *  PLAN OF CARE: Discharge to home after PACU   PATIENT DISPOSITION:  PACU - hemodynamically stable.   COMPLICATIONS: none  FINDINGS: anteverted uterus sounded to 6cm. On hysterscopic view, bilateral ostia visualized. Endometrial polyp noted with stalk at left anterior uterine wall. Otherwise thin appearing endometrium. Total fluid deficit .  PROCEDURE IN DETAIL:  The patient was appropriately consented and taken to the operating room where anesthesia was administered without difficulty. Thromboguards were placed and connected. She was placed in the dorsal lithotomy position in stirrups. She was examined under anesthesia and found to have an anteverted uterus. The patient was then prepped and draped in normal sterile fashion. A long speculum was inserted into the vagina. A single-tooth tenaculum was used to grasp the anterior lip of the cervix. A paracervical block was obtained by injecting a total of 48mL of 1% lidocaine at the 4 and 8 o'clock positions at the cervicovaginal junction. The uterus was sounded to 6cm. The cervical os was sequentially dilated using Pratt dilators to accommodate the hysteroscope. The hysteroscope was introduced under direct visualization and the uterus was distended with normal saline. Bilateral ostia were  visualized. Findings as noted above. The Myosure Lite device was used to resect the polyp. Specimen was collected for pathology. Hemostasis was noted in the uterine cavity. The hysteroscope was removed. A sharp curette was inserted and curettage was performed to obtain global endometrial curettings. The tenaculum was removed from the cervix and good hemostasis was noted at the puncture sites. The patient tolerated the procedure well. The instrument and sponge counts were correct times two. The patient was awakened from anesthesia and taken to the recovery room in stable condition.  Derl Barrow, MD 12/05/22 1:34 PM

## 2022-12-05 NOTE — Interval H&P Note (Signed)
History and Physical Interval Note:  12/05/2022 12:15 PM  Karina Sawyer  has presented today for surgery, with the diagnosis of post menopausal bleeding.  The various methods of treatment have been discussed with the patient and family. After consideration of risks, benefits and other options for treatment, the patient has consented to  Procedure(s) with comments: DILATATION & CURETTAGE/HYSTEROSCOPY WITH MYOSURE LITE (N/A) - Device needed = myosure lite as a surgical intervention.  The patient's history has been reviewed, patient examined, no change in status, stable for surgery.  I have reviewed the patient's chart and labs.  Questions were answered to the patient's satisfaction.     Charlett Nose

## 2022-12-05 NOTE — Anesthesia Procedure Notes (Signed)
Procedure Name: LMA Insertion Date/Time: 12/05/2022 12:58 PM  Performed by: Francie Massing, CRNAPre-anesthesia Checklist: Patient identified, Emergency Drugs available, Suction available and Patient being monitored Patient Re-evaluated:Patient Re-evaluated prior to induction Oxygen Delivery Method: Circle system utilized Preoxygenation: Pre-oxygenation with 100% oxygen Induction Type: IV induction Ventilation: Mask ventilation without difficulty LMA: LMA inserted LMA Size: 4.0 Number of attempts: 1 Airway Equipment and Method: Bite block Placement Confirmation: positive ETCO2 Tube secured with: Tape Dental Injury: Teeth and Oropharynx as per pre-operative assessment

## 2022-12-05 NOTE — Brief Op Note (Signed)
12/05/2022  1:29 PM  PATIENT:  Karina Sawyer  66 y.o. female  PRE-OPERATIVE DIAGNOSIS:  recurrent post menopausal bleeding  POST-OPERATIVE DIAGNOSIS:  recurrent post menopausal bleeding  PROCEDURE:  Hysteroscopy, polypectomy, dilation and curettage   SURGEON:  Surgeon(s) and Role:    * Jaylie Neaves, Terrance Mass, MD - Primary  ANESTHESIA:   local and general  EBL:  5 mL   BLOOD ADMINISTERED:none  DRAINS: none   LOCAL MEDICATIONS USED:  LIDOCAINE  and Amount: 10 ml  SPECIMEN:  Source of Specimen:  endometrial polyp and curettings  DISPOSITION OF SPECIMEN:  PATHOLOGY  COUNTS:  YES  TOURNIQUET:  * No tourniquets in log *  DICTATION: .Note written in EPIC  PLAN OF CARE: Discharge to home after PACU  PATIENT DISPOSITION:  PACU - hemodynamically stable.   Delay start of Pharmacological VTE agent (>24hrs) due to surgical blood loss or risk of bleeding: not applicable

## 2022-12-05 NOTE — Transfer of Care (Signed)
Immediate Anesthesia Transfer of Care Note  Patient: Karina Sawyer  Procedure(s) Performed: Procedure(s) (LRB): DILATATION & CURETTAGE/HYSTEROSCOPY WITH MYOSURE LITE (N/A)  Patient Location: PACU  Anesthesia Type: General  Level of Consciousness: awake, oriented, sedated and patient cooperative  Airway & Oxygen Therapy: Patient Spontanous Breathing and Patient connected to face mask oxygen  Post-op Assessment: Report given to PACU RN and Post -op Vital signs reviewed and stable  Post vital signs: Reviewed and stable  Complications: No apparent anesthesia complications Last Vitals:  Vitals Value Taken Time  BP    Temp    Pulse    Resp 15 12/05/22 1335  SpO2    Vitals shown include unvalidated device data.  Last Pain:  Vitals:   12/05/22 1224  TempSrc: Oral  PainSc: 0-No pain      Patients Stated Pain Goal: 5 (12/05/22 1224)  Complications: No notable events documented.

## 2022-12-05 NOTE — Anesthesia Postprocedure Evaluation (Signed)
Anesthesia Post Note  Patient: Karina Sawyer  Procedure(s) Performed: DILATATION & CURETTAGE/HYSTEROSCOPY WITH MYOSURE LITE     Patient location during evaluation: PACU Anesthesia Type: General Level of consciousness: awake and alert Pain management: pain level controlled Vital Signs Assessment: post-procedure vital signs reviewed and stable Respiratory status: spontaneous breathing, nonlabored ventilation, respiratory function stable and patient connected to nasal cannula oxygen Cardiovascular status: blood pressure returned to baseline and stable Postop Assessment: no apparent nausea or vomiting Anesthetic complications: no  No notable events documented.  Last Vitals:  Vitals:   12/05/22 1415 12/05/22 1430  BP: 115/65 122/70  Pulse: (!) 59 (!) 52  Resp: 16 14  Temp:    SpO2: 95% 96%    Last Pain:  Vitals:   12/05/22 1430  TempSrc:   PainSc: 0-No pain                 Trevor Iha

## 2022-12-05 NOTE — Discharge Instructions (Signed)

## 2022-12-06 ENCOUNTER — Encounter (HOSPITAL_BASED_OUTPATIENT_CLINIC_OR_DEPARTMENT_OTHER): Payer: Self-pay | Admitting: Obstetrics and Gynecology

## 2022-12-06 LAB — SURGICAL PATHOLOGY

## 2022-12-06 NOTE — Addendum Note (Signed)
Addended byDurenda Hurt on: 12/06/2022 08:27 AM   Modules accepted: Orders

## 2022-12-20 DIAGNOSIS — H40013 Open angle with borderline findings, low risk, bilateral: Secondary | ICD-10-CM | POA: Diagnosis not present

## 2023-02-05 DIAGNOSIS — N390 Urinary tract infection, site not specified: Secondary | ICD-10-CM | POA: Diagnosis not present

## 2023-03-19 DIAGNOSIS — M1712 Unilateral primary osteoarthritis, left knee: Secondary | ICD-10-CM | POA: Diagnosis not present

## 2023-03-19 DIAGNOSIS — M19019 Primary osteoarthritis, unspecified shoulder: Secondary | ICD-10-CM | POA: Diagnosis not present

## 2023-03-19 DIAGNOSIS — M79605 Pain in left leg: Secondary | ICD-10-CM | POA: Diagnosis not present

## 2023-03-19 DIAGNOSIS — R600 Localized edema: Secondary | ICD-10-CM | POA: Diagnosis not present

## 2023-03-19 DIAGNOSIS — M179 Osteoarthritis of knee, unspecified: Secondary | ICD-10-CM | POA: Diagnosis not present

## 2023-03-19 DIAGNOSIS — M1990 Unspecified osteoarthritis, unspecified site: Secondary | ICD-10-CM | POA: Diagnosis not present

## 2023-04-11 DIAGNOSIS — R7303 Prediabetes: Secondary | ICD-10-CM | POA: Diagnosis not present

## 2023-04-11 DIAGNOSIS — E782 Mixed hyperlipidemia: Secondary | ICD-10-CM | POA: Diagnosis not present

## 2023-04-11 DIAGNOSIS — E039 Hypothyroidism, unspecified: Secondary | ICD-10-CM | POA: Diagnosis not present

## 2023-04-18 DIAGNOSIS — K7689 Other specified diseases of liver: Secondary | ICD-10-CM | POA: Diagnosis not present

## 2023-04-18 DIAGNOSIS — R030 Elevated blood-pressure reading, without diagnosis of hypertension: Secondary | ICD-10-CM | POA: Diagnosis not present

## 2023-04-18 DIAGNOSIS — R600 Localized edema: Secondary | ICD-10-CM | POA: Diagnosis not present

## 2023-04-18 DIAGNOSIS — M1990 Unspecified osteoarthritis, unspecified site: Secondary | ICD-10-CM | POA: Diagnosis not present

## 2023-04-18 DIAGNOSIS — E782 Mixed hyperlipidemia: Secondary | ICD-10-CM | POA: Diagnosis not present

## 2023-04-24 DIAGNOSIS — H40013 Open angle with borderline findings, low risk, bilateral: Secondary | ICD-10-CM | POA: Diagnosis not present

## 2023-05-08 DIAGNOSIS — M1711 Unilateral primary osteoarthritis, right knee: Secondary | ICD-10-CM | POA: Diagnosis not present

## 2023-05-08 DIAGNOSIS — M19011 Primary osteoarthritis, right shoulder: Secondary | ICD-10-CM | POA: Diagnosis not present

## 2023-05-08 DIAGNOSIS — M25521 Pain in right elbow: Secondary | ICD-10-CM | POA: Diagnosis not present

## 2023-05-08 DIAGNOSIS — M7711 Lateral epicondylitis, right elbow: Secondary | ICD-10-CM | POA: Diagnosis not present

## 2023-05-13 DIAGNOSIS — M19011 Primary osteoarthritis, right shoulder: Secondary | ICD-10-CM | POA: Diagnosis not present

## 2023-09-03 DIAGNOSIS — Z1231 Encounter for screening mammogram for malignant neoplasm of breast: Secondary | ICD-10-CM | POA: Diagnosis not present

## 2023-09-03 DIAGNOSIS — Z01419 Encounter for gynecological examination (general) (routine) without abnormal findings: Secondary | ICD-10-CM | POA: Diagnosis not present

## 2023-09-03 DIAGNOSIS — R350 Frequency of micturition: Secondary | ICD-10-CM | POA: Diagnosis not present

## 2023-10-14 DIAGNOSIS — R7303 Prediabetes: Secondary | ICD-10-CM | POA: Diagnosis not present

## 2023-10-14 DIAGNOSIS — E782 Mixed hyperlipidemia: Secondary | ICD-10-CM | POA: Diagnosis not present

## 2023-10-14 DIAGNOSIS — E039 Hypothyroidism, unspecified: Secondary | ICD-10-CM | POA: Diagnosis not present

## 2023-10-18 DIAGNOSIS — E782 Mixed hyperlipidemia: Secondary | ICD-10-CM | POA: Diagnosis not present

## 2023-10-18 DIAGNOSIS — M25511 Pain in right shoulder: Secondary | ICD-10-CM | POA: Diagnosis not present

## 2023-10-18 DIAGNOSIS — M1990 Unspecified osteoarthritis, unspecified site: Secondary | ICD-10-CM | POA: Diagnosis not present

## 2023-10-18 DIAGNOSIS — I10 Essential (primary) hypertension: Secondary | ICD-10-CM | POA: Diagnosis not present

## 2023-10-18 DIAGNOSIS — Z23 Encounter for immunization: Secondary | ICD-10-CM | POA: Diagnosis not present

## 2023-10-18 DIAGNOSIS — E039 Hypothyroidism, unspecified: Secondary | ICD-10-CM | POA: Diagnosis not present

## 2023-10-23 DIAGNOSIS — M19011 Primary osteoarthritis, right shoulder: Secondary | ICD-10-CM | POA: Diagnosis not present

## 2023-10-23 DIAGNOSIS — M1711 Unilateral primary osteoarthritis, right knee: Secondary | ICD-10-CM | POA: Diagnosis not present

## 2023-11-11 DIAGNOSIS — M19011 Primary osteoarthritis, right shoulder: Secondary | ICD-10-CM | POA: Diagnosis not present

## 2023-11-28 ENCOUNTER — Ambulatory Visit (HOSPITAL_COMMUNITY): Payer: HMO | Attending: Orthopedic Surgery

## 2023-11-28 ENCOUNTER — Ambulatory Visit (HOSPITAL_COMMUNITY): Payer: HMO | Admitting: Occupational Therapy

## 2023-11-28 ENCOUNTER — Other Ambulatory Visit: Payer: Self-pay

## 2023-11-28 ENCOUNTER — Encounter (HOSPITAL_COMMUNITY): Payer: Self-pay | Admitting: Occupational Therapy

## 2023-11-28 DIAGNOSIS — R29898 Other symptoms and signs involving the musculoskeletal system: Secondary | ICD-10-CM | POA: Insufficient documentation

## 2023-11-28 DIAGNOSIS — M25611 Stiffness of right shoulder, not elsewhere classified: Secondary | ICD-10-CM

## 2023-11-28 DIAGNOSIS — M25561 Pain in right knee: Secondary | ICD-10-CM | POA: Diagnosis not present

## 2023-11-28 DIAGNOSIS — R262 Difficulty in walking, not elsewhere classified: Secondary | ICD-10-CM | POA: Insufficient documentation

## 2023-11-28 DIAGNOSIS — M25511 Pain in right shoulder: Secondary | ICD-10-CM | POA: Diagnosis not present

## 2023-11-28 DIAGNOSIS — G8929 Other chronic pain: Secondary | ICD-10-CM | POA: Insufficient documentation

## 2023-11-28 DIAGNOSIS — M6281 Muscle weakness (generalized): Secondary | ICD-10-CM | POA: Diagnosis not present

## 2023-11-28 NOTE — Therapy (Signed)
OUTPATIENT OCCUPATIONAL THERAPY ORTHO EVALUATION  Patient Name: Karina Sawyer MRN: [id number] DOB:03/10/1956, 67 y.o., female Today's Date: 11/28/2023   END OF SESSION:  OT End of Session - 11/28/23 1158     Visit Number 1    Number of Visits 5    Date for OT Re-Evaluation 12/28/23    Authorization Type [employer]    Authorization Time Period --    Authorization - Visit Number --    Authorization - Number of Visits --    Progress Note Due on Visit 10    OT Start Time 1101    OT Stop Time 1140    OT Time Calculation (min) 39 min    Activity Tolerance Patient tolerated treatment well    Behavior During Therapy [facility] for tasks assessed/performed             Past Medical History:  Diagnosis Date   Abnormal results of liver function studies    Acute maxillary sinusitis    around 2003   Arthritis    knees   Chest pain    Chest pain 2022   Per pt, she experienced a burning sensation in her chest and an abnormal EKG dated 04/26/21. She was sent to Dr. Anne Fu, cardiologist on 04/26/21. Echocardiogram demonstrated LVEF  55 - 60% and severe left atrial dilation. Patient states that she was told everything was fine, and that her chest pain was likely acid reflux.   Heart murmur    many years ago told had a murmur but no one recenctly mentioned   History of kidney stones 2003   Hyperlipidemia    Hypertension    Follows w/ PCP, Dr. Catalina Pizza takes HCTZ.   Hypothyroidism    Follows with PCP, Dr. Catalina Pizza. Currently taking Synthroid.   Menopausal and female climacteric states    Morbid obesity (HCC)    PONV (postoperative nausea and vomiting)    Patient states severe nausea and vomiting after anesthesia.   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   PCP: Dr. Nita Sells REFERRING PROVIDER: Dr. Margarita Rana  ONSET DATE: several months  REFERRING DIAG: M19.011 (ICD-10-CM) - Osteoarthritis of right shoulder, unspecified osteoarthritis type   THERAPY DIAG:  Chronic right shoulder pain  Other symptoms and signs involving the musculoskeletal system  Stiffness of right shoulder, not elsewhere classified  Rationale for Evaluation and Treatment: Rehabilitation  SUBJECTIVE:   SUBJECTIVE STATEMENT: This arm is weakening because I need a replacement. Pt accompanied by: family member  PERTINENT HISTORY: Pt is a 67 y/o female presenting with chronic right shoulder pain. Pt has received several injections, 2 in the last month one of which was US guided, with some relief. Pt would like to try conservative treatment before opting for  TSA.   PRECAUTIONS: None  WEIGHT BEARING RESTRICTIONS: No  PAIN:  Are you having pain? No  FALLS: Has patient fallen in last 6 months? No  PLOF: Independent  PATIENT GOALS: To be able to move my arm more.   NEXT MD VISIT: 12/2023  OBJECTIVE:   HAND DOMINANCE: Right  ADLs: Overall ADLs: I can't hardly brush my teeth or brush my hair. My hand shakes when holding a cup of water. Pt has difficulty dressing and undressing, has to have help unhooking bra. Pt is unable to reach overhead or behind back.   FUNCTIONAL OUTCOME MEASURES: FOTO: 45/100  UPPER EXTREMITY ROM:       Assessed in sitting, er/IR adducted  Active ROM Right eval  Shoulder flexion 94  Shoulder abduction 103  Shoulder internal  rotation 90  Shoulder external rotation 18  (Blank rows = not tested)  Assessed in supine, er/IR adducted  Passive ROM Right eval  Shoulder flexion 110  Shoulder abduction 108  Shoulder internal rotation 90  Shoulder external rotation 20  (Blank rows = not tested)    UPPER EXTREMITY MMT:     Assessed in sitting, er/IR adducted  MMT Right eval  Shoulder flexion 3-/5  Shoulder abduction 3-/5  Shoulder internal rotation 3/5  Shoulder external rotation 3-/5  (Blank rows = not tested)  HAND FUNCTION: Grip strength: Right: 48 lbs; Left: 45 lbs  SENSATION: WFL  EDEMA: None  COGNITION: Overall cognitive status: Within functional limits for tasks assessed  OBSERVATIONS: mod fascial restrictions along right upper arm, trapezius, and scapular regions; degenerative creptius noted with mobility   TODAY'S TREATMENT:                                                                                                                              DATE:  Eval:  -AA/ROM: Supine-protraction, flexion, horizontal abduction, er, 10 reps -AA/ROM: sitting-protraction, abduction, 10 reps    PATIENT EDUCATION: Education details: AA/ROM Person educated: Patient and Child(ren) Education method: Explanation, Demonstration, and Handouts Education comprehension: verbalized understanding and returned demonstration  HOME EXERCISE PROGRAM: Carley Hammed: AA/ROM  GOALS: Goals reviewed with patient? Yes   SHORT TERM GOALS: Target date: 12/28/23  Pt will be provided with and educated on HEP to improve mobility in RUE required for use during ADL completion.   Goal status: INITIAL  2.  Pt will increase RUE A/ROM by 10 degrees to improve ability to use RUE during dressing tasks with minimal compensatory techniques.   Goal status: INITIAL  3.  Pt will increase RUE strength to 3+/5 to improve ability to reach for items at waist to chest height during dressing, bathing, and grooming tasks.   Goal  status: INITIAL  4.  Pt will decrease pain in RUE to 3/10 or less to improve ability to perform grooming and bathing tasks with minimal discomfort.   Goal status: INITIAL  5.  Pt will decrease RUE fascial restrictions to min amounts or  less to improve mobility required for functional reaching tasks.   Goal status: INITIAL   ASSESSMENT:  CLINICAL IMPRESSION: Patient is a 67 y.o. female who was seen today for occupational therapy evaluation for right shoulder pain. Pt presents with increased pain and fascial restrictions, decreased ROM, strength, and functional use of the RUE during ADL tasks.  Pt with moderate popping and crepitus during mobility tasks in evaluation, minimal pain today however reports sometimes has pain with popping.   PERFORMANCE DEFICITS: in functional skills including in functional skills including ADLs, IADLs, coordination, tone, ROM, strength, pain, fascial restrictions, muscle spasms, and UE functional use  IMPAIRMENTS: are limiting patient from ADLs, IADLs, rest and sleep, and leisure.   COMORBIDITIES: has no other co-morbidities that affects occupational performance. Patient will benefit from skilled OT to address above impairments and improve overall function.  MODIFICATION OR ASSISTANCE TO COMPLETE EVALUATION: No modification of tasks or assist necessary to complete an evaluation.  OT OCCUPATIONAL PROFILE AND HISTORY: Problem focused assessment: Including review of records relating to presenting problem.  CLINICAL DECISION MAKING: LOW - limited treatment options, no task modification necessary  REHAB POTENTIAL: Good  EVALUATION COMPLEXITY: Low      PLAN:  OT FREQUENCY: 1x/week  OT DURATION: 4 weeks  PLANNED INTERVENTIONS: 97168 OT Re-evaluation, 97535 self care/ADL training, 21308 therapeutic exercise, 97530 therapeutic activity, 97140 manual therapy, 97014 electrical stimulation unattended, patient/family education, and DME and/or AE  instructions  RECOMMENDED OTHER SERVICES: None  CONSULTED AND AGREED WITH PLAN OF CARE: Patient  PLAN FOR NEXT SESSION: Follow up on HEP, initiate manual techniques, AA/ROM to A/ROM, functional reaching   Ezra Sites, OTR/L  [phone] 11/28/2023, 12:13 PM

## 2023-11-28 NOTE — Therapy (Signed)
[facility] OUTPATIENT PHYSICAL THERAPY EVALUATION (LOWER EXTREMITY)   Patient Name: Karina Sawyer MRN: [id number] DOB:1956/04/13, 67 y.o., female Today's Date: 11/28/2023  END OF SESSION:   PT End of Session - 11/28/23 1146     Visit Number 1    Number of Visits 6    Authorization Type [employer]    PT Start Time 1145    PT Stop Time 1225    PT Time Calculation (min) 40 min    Activity Tolerance Patient tolerated treatment well              Past Medical History:  Diagnosis Date   Abnormal results of liver function studies    Acute maxillary sinusitis    around 2003   Arthritis    knees   Chest pain    Chest pain 2022   Per pt, she experienced a burning sensation in her chest and an abnormal EKG dated 04/26/21. She was sent to Dr. Anne Fu, cardiologist on 04/26/21. Echocardiogram demonstrated LVEF  55 - 60% and severe left atrial dilation. Patient states that she was told everything was fine, and that her chest pain was likely acid reflux.   Heart murmur    many years ago told had a murmur but no one recenctly mentioned   History of kidney stones 2003   Hyperlipidemia    Hypertension    Follows w/ PCP, Dr. Catalina Pizza takes HCTZ.   Hypothyroidism    Follows with PCP, Dr. Catalina Pizza. Currently taking Synthroid.   Menopausal and female climacteric states    Morbid obesity (HCC)    PONV (postoperative nausea and vomiting)    Patient states severe nausea and vomiting after anesthesia.   Wears glasses    reading only   Past Surgical History:  Procedure Laterality Date   APPENDECTOMY  1971   COLONOSCOPY  2015   [facility] - no polyps per pt   DILATATION & CURETTAGE/HYSTEROSCOPY WITH MYOSURE N/A 12/05/2022   Procedure: DILATATION & CURETTAGE/HYSTEROSCOPY WITH MYOSURE LITE;  Surgeon: Charlett Nose, MD;  Location: [facility] [place];  Service: Gynecology;  Laterality: N/A;  Device needed = myosure lite   NECK SURGERY     growth removed at birth   ovary  removed     age 67   TUBAL LIGATION  08/24/1989   early 68's   WISDOM TOOTH EXTRACTION     many years ago   There are no problems to display for this patient.   PCP: Benita Stabile, MDPCP - General   REFERRING PROVIDER: Sheral Apley, MDRef Provider   REFERRING DIAG: M17.11 (ICD-10-CM) - Osteoarthritis of right knee, unspecified osteoarthritis type   Rationale for Evaluation and Treatment: Rehabilitation  THERAPY DIAG:  Right knee pain, unspecified chronicity  Muscle weakness (generalized)  ONSET DATE: within 3 months --------------------------------------------------------------------------------------------- SUBJECTIVE:  SUBJECTIVE STATEMENT: Within the last 3 months, patient's right knee pain has increased and has been popping. Cortisone shot to the right knee in October 2024. Patient has been referred to OPPT.   PERTINENT HISTORY:  Morbid obesity  PAIN:  Are you having pain? Yes: NPRS scale: 6-7/10 Pain location: medial right knee  Pain description: dully/achy Aggravating factors: walking Relieving factors: sitting  PRECAUTIONS: None  RED FLAGS: None   WEIGHT BEARING RESTRICTIONS: No  FALLS:  Has patient fallen in last 6 months? No  LIVING ENVIRONMENT: Lives with: lives alone Lives in: House/apartment Stairs: Yes: External: 2 steps; none Has following equipment at home: Single point cane and Walker - 2 wheeled  OCCUPATION: retired  PLOF: Independent  PATIENT GOALS: Patient wants to avoid right knee surgery   NEXT MD VISIT: TBD  --------------------------------------------------------------------------------------------- OBJECTIVE:   DIAGNOSTIC FINDINGS:  N/a  PATIENT SURVEYS:  LEFS 25/80  SCREENING FOR RED FLAGS: Bowel or bladder incontinence: No Spinal  tumors: No Cauda equina syndrome: No Compression fracture: No Abdominal aneurysm: No  COGNITION: Overall cognitive status: Within functional limits for tasks assessed  POSTURE: No Significant postural limitations      FUNCTIONAL TESTS:  5 times sit to stand: next session 2 minute walk test: next session   GAIT ANALYSIS: Distance walked: 60ft Assistive device utilized: None Level of assistance: Complete Independence Comments: Patient with moderate right lower extremity antalgia  SENSATION: WFL    LOWER EXTREMITY MMT:    MMT Right eval Left eval  Hip flexion 3/5   Hip extension    Hip abduction    Hip adduction    Hip internal rotation    Hip external rotation    Knee flexion 3-/5 4-/5  Knee extension 3+/5 4-/5  Ankle dorsiflexion    Ankle plantarflexion    Ankle inversion    Ankle eversion     (Blank rows = not tested)  LOWER EXTREMITY ROM:     Active  Right eval Left eval  Hip flexion    Hip extension    Hip abduction    Hip adduction    Hip internal rotation    Hip external rotation    Knee flexion 0-80 0-90  Knee extension    Ankle dorsiflexion    Ankle plantarflexion    Ankle inversion    Ankle eversion     (Blank rows = not tested)  LOWER EXTREMITY SPECIAL TESTS Patellafemoral grind test: positive      PALPATION: Moderate tenderness to palpation right medial knee Moderate hypermobility patella with lateral tracking  INTEGUMENTARY   WFL --------------------------------------------------------------------------------------------- TODAY'S TREATMENT:                                                                                                                              DATE:   11/28/23 Gait Training with single point cane  PT initial eval   PATIENT EDUCATION:  Education details: HEP Person educated: Patient Education method: Hospital doctor  comprehension: verbalized understanding  HOME EXERCISE PROGRAM: Access Code:  [email or web address] URL: [email or web address] Date: 11/28/2023 Prepared by: Seymour Bars  Patient Education - Reciprocal Gait with Gilmer Mor --------------------------------------------------------------------------------------------- ASSESSMENT:  CLINICAL IMPRESSION: Patient is a 67 y.o. y.o. female who was seen today for physical therapy evaluation and treatment for right knee pain, weakness, difficulty walking. Patient presents to PT with the following objective impairments: Abnormal gait, decreased activity tolerance, decreased mobility, difficulty walking, decreased ROM, decreased strength, improper body mechanics, obesity, and pain. These impairments limit the patient in activities such as carrying, lifting, bending, standing, squatting, stairs, transfers, and locomotion level. These impairments also limit the patient in participation such as meal prep, cleaning, laundry, interpersonal relationship, driving, shopping, community activity, and yard work. The patient will benefit from PT to address the limitations/impairments listed below to return to their prior level of function in the domains of activity and participation.    PERSONAL FACTORS:  obesity  are also affecting patient's functional outcome.   REHAB POTENTIAL: Good  CLINICAL DECISION MAKING: Stable/uncomplicated  EVALUATION COMPLEXITY: Low  --------------------------------------------------------------------------------------------- GOALS: Goals reviewed with patient? No  SHORT TERM GOALS: Target date: 12/19/2023   1. Patient will be independent with a basic stretching/strengthening HEP  Baseline:  Goal status: INITIAL 2. Patient will be able to demonstrate right knee flexion active range of motion to 0-85 degrees to facilitate ADL completion, lifting, squatting. Baseline:  Goal status: INITIAL   LONG TERM GOALS: Target date: 01/09/2024    Patient will score a >/= 35/80 on the LEFS    to demonstrate a Minimally  Clinically Importance Difference (MCID) in ADL completion, home/community ambulation, and lifting/bending/squatting. Baseline:  Goal status: INITIAL  2.  Patient will complete the 5 times sit to stand:    within 16s to demonstrate an improvement in ADL completion, stair negotiation, household/community ambulation, and self-care Baseline:  Goal status: INITIAL  3.  Patient will be independent with a comprehensive strengthening HEP  Baseline:  Goal status: INITIAL  4. Patient will be able to demonstrate right knee flexion active range of motion to 0-100 degrees to facilitate ADL completion, lifting, squatting. Baseline:  Goal status: INITIAL  --------------------------------------------------------------------------------------------- PLAN:  PT FREQUENCY: 1x/week  PT DURATION: 6 weeks  PLANNED INTERVENTIONS: 97110-Therapeutic exercises, 97530- Therapeutic activity, 97112- Neuromuscular re-education, 97535- Self Care, 54098- Manual therapy, [phone]- Gait training, Patient/Family education, Balance training, Stair training, Taping, Dry Needling, Joint mobilization, Joint manipulation, Spinal manipulation, Spinal mobilization, DME instructions, Cryotherapy, and Moist heat.  PLAN FOR NEXT SESSION: Progress lower extremity strengthening as tolerated    Seymour Bars, PT 11/28/2023, 11:49 AM

## 2023-11-28 NOTE — Patient Instructions (Signed)

## 2023-12-03 DIAGNOSIS — H524 Presbyopia: Secondary | ICD-10-CM | POA: Diagnosis not present

## 2023-12-03 DIAGNOSIS — H40053 Ocular hypertension, bilateral: Secondary | ICD-10-CM | POA: Diagnosis not present

## 2023-12-06 ENCOUNTER — Ambulatory Visit (HOSPITAL_COMMUNITY): Payer: HMO | Admitting: Occupational Therapy

## 2023-12-06 ENCOUNTER — Encounter (HOSPITAL_COMMUNITY): Payer: HMO | Admitting: Occupational Therapy

## 2023-12-06 ENCOUNTER — Encounter (HOSPITAL_COMMUNITY): Payer: Self-pay | Admitting: Occupational Therapy

## 2023-12-06 ENCOUNTER — Ambulatory Visit (HOSPITAL_COMMUNITY): Payer: HMO

## 2023-12-06 DIAGNOSIS — M25561 Pain in right knee: Secondary | ICD-10-CM

## 2023-12-06 DIAGNOSIS — M6281 Muscle weakness (generalized): Secondary | ICD-10-CM

## 2023-12-06 DIAGNOSIS — R262 Difficulty in walking, not elsewhere classified: Secondary | ICD-10-CM

## 2023-12-06 DIAGNOSIS — G8929 Other chronic pain: Secondary | ICD-10-CM

## 2023-12-06 DIAGNOSIS — R29898 Other symptoms and signs involving the musculoskeletal system: Secondary | ICD-10-CM

## 2023-12-06 DIAGNOSIS — M25611 Stiffness of right shoulder, not elsewhere classified: Secondary | ICD-10-CM

## 2023-12-06 NOTE — Therapy (Signed)
[facility] OUTPATIENT PHYSICAL THERAPY TREATMENT    Patient Name: Karina Sawyer MRN: [id number] DOB:1956/07/14, 67 y.o., female Today's Date: 12/06/2023  END OF SESSION:   PT End of Session - 12/06/23 1144     Visit Number 2    Number of Visits 6    Authorization Type [employer]    PT Start Time 1142    PT Stop Time 1222    PT Time Calculation (min) 40 min    Activity Tolerance Patient tolerated treatment well    Behavior During Therapy [facility] for tasks assessed/performed              Past Medical History:  Diagnosis Date   Abnormal results of liver function studies    Acute maxillary sinusitis    around 2003   Arthritis    knees   Chest pain    Chest pain 2022   Per pt, she experienced a burning sensation in her chest and an abnormal EKG dated 04/26/21. She was sent to Dr. Anne Fu, cardiologist on 04/26/21. Echocardiogram demonstrated LVEF  55 - 60% and severe left atrial dilation. Patient states that she was told everything was fine, and that her chest pain was likely acid reflux.   Heart murmur    many years ago told had a murmur but no one recenctly mentioned   History of kidney stones 2003   Hyperlipidemia    Hypertension    Follows w/ PCP, Dr. Catalina Pizza takes HCTZ.   Hypothyroidism    Follows with PCP, Dr. Catalina Pizza. Currently taking Synthroid.   Menopausal and female climacteric states    Morbid obesity (HCC)    PONV (postoperative nausea and vomiting)    Patient states severe nausea and vomiting after anesthesia.   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   PCP: Benita Stabile, MDPCP - General   REFERRING PROVIDER: Sheral Apley, MDRef Provider   REFERRING DIAG: M17.11 (ICD-10-CM) - Osteoarthritis of right knee, unspecified osteoarthritis type   Rationale for Evaluation and Treatment: Rehabilitation  THERAPY DIAG:  No diagnosis found.  ONSET DATE: within 3 months --------------------------------------------------------------------------------------------- SUBJECTIVE:  SUBJECTIVE STATEMENT: Today: Patient reports using single point cane as recommended by PT has decreased her knee pain   IE: Within the last 3 months, patient's right knee pain has increased and has been popping. Cortisone shot to the right knee in October 2024. Patient has been referred to OPPT.   PERTINENT HISTORY:  Morbid obesity  PAIN:  Are you having pain? Yes: NPRS scale: 6-7/10 Pain location: medial right knee  Pain description: dully/achy Aggravating factors: walking Relieving factors: sitting  PRECAUTIONS: None  RED FLAGS: None   WEIGHT BEARING RESTRICTIONS: No  FALLS:  Has patient fallen in last 6 months? No  LIVING ENVIRONMENT: Lives with: lives alone Lives in: House/apartment Stairs: Yes: External: 2 steps; none Has following equipment at home: Single point cane and Walker - 2 wheeled  OCCUPATION: retired  PLOF: Independent  PATIENT GOALS: Patient wants to avoid right knee surgery   NEXT MD VISIT: TBD  --------------------------------------------------------------------------------------------- OBJECTIVE:   DIAGNOSTIC FINDINGS:   N/a  PATIENT SURVEYS:  LEFS 25/80  SCREENING FOR RED FLAGS: Bowel or bladder incontinence: No Spinal tumors: No Cauda equina syndrome: No Compression fracture: No Abdominal aneurysm: No  COGNITION: Overall cognitive status: Within functional limits for tasks assessed  POSTURE: No Significant postural limitations      FUNCTIONAL TESTS:  5 times sit to stand: next session 2 minute walk test: next session   GAIT ANALYSIS: Distance walked: 19ft Assistive device utilized: None Level of assistance: Complete Independence Comments: Patient with moderate right lower extremity antalgia  SENSATION: WFL    LOWER EXTREMITY MMT:    MMT Right eval Left eval  Hip flexion 3/5   Hip extension    Hip abduction    Hip adduction    Hip internal rotation    Hip external rotation    Knee flexion 3-/5 4-/5  Knee extension 3+/5 4-/5  Ankle dorsiflexion    Ankle plantarflexion    Ankle inversion    Ankle eversion     (Blank rows = not tested)  LOWER EXTREMITY ROM:     Active  Right eval Left eval  Hip flexion    Hip extension    Hip abduction    Hip adduction    Hip internal rotation    Hip external rotation    Knee flexion 0-80 0-90  Knee extension    Ankle dorsiflexion    Ankle plantarflexion    Ankle inversion    Ankle eversion     (Blank rows = not tested)  LOWER EXTREMITY SPECIAL TESTS Patellafemoral grind test: positive      PALPATION: Moderate tenderness to palpation right medial knee Moderate hypermobility patella with lateral tracking  INTEGUMENTARY   WFL --------------------------------------------------------------------------------------------- TODAY'S TREATMENT:                                                                                                                              DATE:  12/06/23 Supine  Manual therapy to right knee:  soft tissue mobilization to knee muscles, joint mobilizations grade 2-3 for knee extension/flexion ROM,  PROM of knee, mobilization with movement into knee flexion Heel slides + strap 10x3"H  SAQs with 2# 2x10 LAQs with 2# 2x10       11/28/23 Gait Training with single point cane  PT initial eval      PATIENT EDUCATION:  Education details: HEP Person educated: Patient Education method: Explanation Education comprehension: verbalized understanding  HOME EXERCISE PROGRAM: Access Code: [email or web address] URL: [email or web address] Date: 12/06/2023 Prepared by: Seymour Bars  Exercises - Supine Quad Set  - 1-2 x daily - 2 sets - 10 reps - Supine Heel Slide with Strap (Mirrored)  - 1-2 x daily - 2 sets - 10 reps - Sitting Knee Extension with Resistance  - 1-2 x daily - 7 x weekly - 2 sets - 10 reps  Patient Education - Reciprocal Gait with Cane --------------------------------------------------------------------------------------------- ASSESSMENT:  CLINICAL IMPRESSION:  PT began session with manual therapy to decrease pain, improve ROM to right knee. Patient tolerates manual therapy well; had (3) knee extensions prior to manual therapy. PT then progressed Quad Strength using SAQS and LAQs with resistance; tolerated well with no pain. PT updated HEP to improve lower extremity strength. Patient will continue to benefit form PT to return to prior level of function   IE: Patient is a 67 y.o. y.o. female who was seen today for physical therapy evaluation and treatment for right knee pain, weakness, difficulty walking. Patient presents to PT with the following objective impairments: Abnormal gait, decreased activity tolerance, decreased mobility, difficulty walking, decreased ROM, decreased strength, improper body mechanics, obesity, and pain. These impairments limit the patient in activities such as carrying, lifting, bending, standing, squatting, stairs, transfers, and locomotion level. These impairments also limit the patient in participation such as meal prep, cleaning, laundry,  interpersonal relationship, driving, shopping, community activity, and yard work. The patient will benefit from PT to address the limitations/impairments listed below to return to their prior level of function in the domains of activity and participation.    PERSONAL FACTORS:  obesity  are also affecting patient's functional outcome.   REHAB POTENTIAL: Good  CLINICAL DECISION MAKING: Stable/uncomplicated  EVALUATION COMPLEXITY: Low  --------------------------------------------------------------------------------------------- GOALS: Goals reviewed with patient? No  SHORT TERM GOALS: Target date: 12/19/2023   1. Patient will be independent with a basic stretching/strengthening HEP  Baseline:  Goal status: INITIAL 2. Patient will be able to demonstrate right knee flexion active range of motion to 0-85 degrees to facilitate ADL completion, lifting, squatting. Baseline:  Goal status: INITIAL   LONG TERM GOALS: Target date: 01/09/2024    Patient will score a >/= 35/80 on the LEFS    to demonstrate a Minimally Clinically Importance Difference (MCID) in ADL completion, home/community ambulation, and lifting/bending/squatting. Baseline:  Goal status: INITIAL  2.  Patient will complete the 5 times sit to stand:    within 16s to demonstrate an improvement in ADL completion, stair negotiation, household/community ambulation, and self-care Baseline:  Goal status: INITIAL  3.  Patient will be independent with a comprehensive strengthening HEP  Baseline:  Goal status: INITIAL  4. Patient will be able to demonstrate right knee flexion active range of motion to 0-100 degrees to facilitate ADL completion, lifting, squatting. Baseline:  Goal status: INITIAL  --------------------------------------------------------------------------------------------- PLAN:  PT FREQUENCY: 1x/week  PT DURATION: 6 weeks  PLANNED INTERVENTIONS: 97110-Therapeutic exercises, 97530- Therapeutic activity,  [id number]- Neuromuscular re-education, 97535- Self Care, 47829- Manual therapy, [phone]- Gait training, Patient/Family education,  Balance training, Stair training, Taping, Dry Needling, Joint mobilization, Joint manipulation, Spinal manipulation, Spinal mobilization, DME instructions, Cryotherapy, and Moist heat.  PLAN FOR NEXT SESSION: Progress lower extremity strengthening as tolerated    Seymour Bars, PT 12/06/2023, 11:45 AM

## 2023-12-06 NOTE — Therapy (Signed)
OUTPATIENT OCCUPATIONAL THERAPY ORTHO TREATMENT  Patient Name: Karina Sawyer MRN: [id number] DOB:Feb 18, 1956, 67 y.o., female Today's Date: 12/06/2023   END OF SESSION:  OT End of Session - 12/06/23 1146     Visit Number 2    Number of Visits 5    Date for OT Re-Evaluation 12/28/23    Authorization Type [employer]    Progress Note Due on Visit 10    OT Start Time 1102    OT Stop Time 1140    OT Time Calculation (min) 38 min    Activity Tolerance Patient tolerated treatment well    Behavior During Therapy [facility] for tasks assessed/performed              Past Medical History:  Diagnosis Date   Abnormal results of liver function studies    Acute maxillary sinusitis    around 2003   Arthritis    knees   Chest pain    Chest pain 2022   Per pt, she experienced a burning sensation in her chest and an abnormal EKG dated 04/26/21. She was sent to Dr. Anne Fu, cardiologist on 04/26/21. Echocardiogram demonstrated LVEF  55 - 60% and severe left atrial dilation. Patient states that she was told everything was fine, and that her chest pain was likely acid reflux.   Heart murmur    many years ago told had a murmur but no one recenctly mentioned   History of kidney stones 2003   Hyperlipidemia    Hypertension    Follows w/ PCP, Dr. Catalina Pizza takes HCTZ.   Hypothyroidism    Follows with PCP, Dr. Catalina Pizza. Currently taking Synthroid.   Menopausal and female climacteric states    Morbid obesity (HCC)    PONV (postoperative nausea and vomiting)    Patient states severe nausea and vomiting after anesthesia.   Wears glasses    reading only   Past Surgical History:  Procedure Laterality Date   APPENDECTOMY  1971   COLONOSCOPY  2015   [facility] - no polyps per pt   DILATATION & CURETTAGE/HYSTEROSCOPY WITH MYOSURE N/A 12/05/2022   Procedure: DILATATION & CURETTAGE/HYSTEROSCOPY WITH MYOSURE LITE;  Surgeon: Charlett Nose, MD;  Location: [facility];   Service: Gynecology;  Laterality: N/A;  Device needed = myosure lite   NECK SURGERY     growth removed at birth   ovary removed     age 34   TUBAL LIGATION  08/24/1989   early 70's   WISDOM TOOTH EXTRACTION     many years ago   There are no active problems to display for this patient.   PCP: Dr. Nita Sells REFERRING PROVIDER: Dr. Margarita Rana  ONSET DATE: several months  REFERRING DIAG: M19.011 (ICD-10-CM) - Osteoarthritis of right shoulder, unspecified osteoarthritis type   THERAPY DIAG:  Chronic right shoulder pain  Other symptoms and signs involving the musculoskeletal system  Stiffness of right shoulder, not elsewhere classified  Rationale for Evaluation and Treatment: Rehabilitation  SUBJECTIVE:   SUBJECTIVE STATEMENT: It is moving much better  Pt accompanied by: family member  PERTINENT HISTORY: Pt is a 67 y/o female presenting with chronic right shoulder pain. Pt has received several injections, 2 in the last month one of which was US guided, with some relief. Pt would like to try conservative treatment before opting for TSA.   PRECAUTIONS: None  WEIGHT BEARING RESTRICTIONS: No  PAIN:  Are you having pain? No  FALLS: Has patient fallen in last  6 months? No  PLOF: Independent  PATIENT GOALS: To be able to move my arm more.   NEXT MD VISIT: 12/2023  OBJECTIVE:   HAND DOMINANCE: Right  ADLs: Overall ADLs: I can't hardly brush my teeth or brush my hair. My hand shakes when holding a cup of water. Pt has difficulty dressing and undressing, has to have help unhooking bra. Pt is unable to reach overhead or behind back.   FUNCTIONAL OUTCOME MEASURES: FOTO: 45/100  UPPER EXTREMITY ROM:       Assessed in sitting, er/IR adducted  Active ROM Right eval  Shoulder flexion 94  Shoulder abduction 103  Shoulder internal rotation 90  Shoulder external rotation 18  (Blank rows = not tested)  Assessed in supine, er/IR adducted  Passive ROM Right eval   Shoulder flexion 110  Shoulder abduction 108  Shoulder internal rotation 90  Shoulder external rotation 20  (Blank rows = not tested)    UPPER EXTREMITY MMT:     Assessed in sitting, er/IR adducted  MMT Right eval  Shoulder flexion 3-/5  Shoulder abduction 3-/5  Shoulder internal rotation 3/5  Shoulder external rotation 3-/5  (Blank rows = not tested)  HAND FUNCTION: Grip strength: Right: 48 lbs; Left: 45 lbs  SENSATION: WFL  EDEMA: None  COGNITION: Overall cognitive status: Within functional limits for tasks assessed  OBSERVATIONS: mod fascial restrictions along right upper arm, trapezius, and scapular regions; degenerative creptius noted with mobility   TODAY'S TREATMENT:                                                                                                                              DATE:   12/06/23 -AA/ROM: Supine-protraction, flexion, horizontal abduction, er, 10 reps - Scapular strengthening: red theraband, row, retraction 10x each  - Proximal shoulder strength: paddles, criss cross, circles and reverse circles 30 seconds 3 sets each  - UBE bike: 2 mins forwards 2 mins backwards 3.5 pace     Eval:  -AA/ROM: Supine-protraction, flexion, horizontal abduction, er, 10 reps -AA/ROM: sitting-protraction, abduction, 10 reps    PATIENT EDUCATION: Education details: AA/ROM Person educated: Patient and Child(ren) Education method: Explanation, Facilities manager, and Handouts Education comprehension: verbalized understanding and returned demonstration  HOME EXERCISE PROGRAM: Carley Hammed: AA/ROM  GOALS: Goals reviewed with patient? Yes   SHORT TERM GOALS: Target date: 12/28/23  Pt will be provided with and educated on HEP to improve mobility in RUE required for use during ADL completion.   Goal status: INITIAL  2.  Pt will increase RUE A/ROM by 10 degrees to improve ability to use RUE during dressing tasks with minimal compensatory techniques.   Goal  status: INITIAL  3.  Pt will increase RUE strength to 3+/5 to improve ability to reach for items at waist to chest height during dressing, bathing, and grooming tasks.   Goal status: INITIAL  4.  Pt will decrease pain in RUE to 3/10 or less to improve ability  to perform grooming and bathing tasks with minimal discomfort.   Goal status: INITIAL  5.  Pt will decrease RUE fascial restrictions to min amounts or less to improve mobility required for functional reaching tasks.   Goal status: INITIAL   ASSESSMENT:  CLINICAL IMPRESSION: Pt reports that she feels that she is able to move her shoulder much better. Noted significant popping an crepitus in R shoulder with range of motion. Added in scapular and shoulder strengthening this session. Gave proximal shoulder strengthening for HEP. Rest breaks given throughout session due to fatigue. VC throughout session for form.   PERFORMANCE DEFICITS: in functional skills including in functional skills including ADLs, IADLs, coordination, tone, ROM, strength, pain, fascial restrictions, muscle spasms, and UE functional use  IMPAIRMENTS: are limiting patient from ADLs, IADLs, rest and sleep, and leisure.   COMORBIDITIES: has no other co-morbidities that affects occupational performance. Patient will benefit from skilled OT to address above impairments and improve overall function.  MODIFICATION OR ASSISTANCE TO COMPLETE EVALUATION: No modification of tasks or assist necessary to complete an evaluation.  OT OCCUPATIONAL PROFILE AND HISTORY: Problem focused assessment: Including review of records relating to presenting problem.  CLINICAL DECISION MAKING: LOW - limited treatment options, no task modification necessary  REHAB POTENTIAL: Good  EVALUATION COMPLEXITY: Low      PLAN:  OT FREQUENCY: 1x/week  OT DURATION: 4 weeks  PLANNED INTERVENTIONS: 97168 OT Re-evaluation, 97535 self care/ADL training, 47829 therapeutic exercise, 97530  therapeutic activity, 97140 manual therapy, 97014 electrical stimulation unattended, patient/family education, and DME and/or AE instructions  RECOMMENDED OTHER SERVICES: None  CONSULTED AND AGREED WITH PLAN OF CARE: Patient  PLAN FOR NEXT SESSION: Follow up on HEP, initiate manual techniques, AA/ROM to A/ROM, functional reaching   Lurena Joiner , OTR/L  [phone] 12/06/2023, 11:47 AM

## 2023-12-12 ENCOUNTER — Encounter (HOSPITAL_COMMUNITY): Payer: Self-pay | Admitting: Occupational Therapy

## 2023-12-12 ENCOUNTER — Ambulatory Visit (HOSPITAL_COMMUNITY): Payer: HMO

## 2023-12-12 ENCOUNTER — Ambulatory Visit (HOSPITAL_COMMUNITY): Payer: HMO | Admitting: Occupational Therapy

## 2023-12-12 DIAGNOSIS — R29898 Other symptoms and signs involving the musculoskeletal system: Secondary | ICD-10-CM

## 2023-12-12 DIAGNOSIS — R262 Difficulty in walking, not elsewhere classified: Secondary | ICD-10-CM

## 2023-12-12 DIAGNOSIS — M25611 Stiffness of right shoulder, not elsewhere classified: Secondary | ICD-10-CM

## 2023-12-12 DIAGNOSIS — M25561 Pain in right knee: Secondary | ICD-10-CM

## 2023-12-12 DIAGNOSIS — G8929 Other chronic pain: Secondary | ICD-10-CM

## 2023-12-12 DIAGNOSIS — M6281 Muscle weakness (generalized): Secondary | ICD-10-CM

## 2023-12-12 NOTE — Therapy (Signed)
OUTPATIENT OCCUPATIONAL THERAPY ORTHO TREATMENT  Patient Name: Karina Sawyer MRN: [id number] DOB:1956/03/06, 67 y.o., female Today's Date: 12/12/2023   END OF SESSION:  OT End of Session - 12/12/23 1518     Visit Number 3    Number of Visits 5    Date for OT Re-Evaluation 12/28/23    Authorization Type [employer]    Progress Note Due on Visit 10    OT Start Time 1350    OT Stop Time 1429    OT Time Calculation (min) 39 min    Activity Tolerance Patient tolerated treatment well    Behavior During Therapy [facility] for tasks assessed/performed               Past Medical History:  Diagnosis Date   Abnormal results of liver function studies    Acute maxillary sinusitis    around 2003   Arthritis    knees   Chest pain    Chest pain 2022   Per pt, she experienced a burning sensation in her chest and an abnormal EKG dated 04/26/21. She was sent to Dr. Anne Fu, cardiologist on 04/26/21. Echocardiogram demonstrated LVEF  55 - 60% and severe left atrial dilation. Patient states that she was told everything was fine, and that her chest pain was likely acid reflux.   Heart murmur    many years ago told had a murmur but no one recenctly mentioned   History of kidney stones 2003   Hyperlipidemia    Hypertension    Follows w/ PCP, Dr. Catalina Pizza takes HCTZ.   Hypothyroidism    Follows with PCP, Dr. Catalina Pizza. Currently taking Synthroid.   Menopausal and female climacteric states    Morbid obesity (HCC)    PONV (postoperative nausea and vomiting)    Patient states severe nausea and vomiting after anesthesia.   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   PCP: Dr. Nita Sells REFERRING PROVIDER: Dr. Margarita Rana  ONSET DATE: several months  REFERRING DIAG: M19.011 (ICD-10-CM) - Osteoarthritis of right shoulder, unspecified osteoarthritis type   THERAPY DIAG:  Chronic right shoulder pain  Other symptoms and signs involving the musculoskeletal system  Stiffness of right shoulder, not elsewhere classified  Rationale for Evaluation and Treatment: Rehabilitation  SUBJECTIVE:   SUBJECTIVE STATEMENT: S: "I haven't done my exercises like I should with getting ready for Christmas." Pt accompanied by: family member  PERTINENT HISTORY: Pt is a 67 y/o female presenting with chronic right shoulder pain. Pt has received several injections, 2 in the last month one of which was US guided, with some relief. Pt would like to try conservative treatment before opting for TSA.   PRECAUTIONS: None  WEIGHT BEARING RESTRICTIONS: No  PAIN:  Are you having  pain? No  FALLS: Has patient fallen in last 6 months? No  PLOF: Independent  PATIENT GOALS: To be able to move my arm more.   NEXT MD VISIT: 12/2023  OBJECTIVE:   HAND DOMINANCE: Right  ADLs: Overall ADLs: I can't hardly brush my teeth or brush my hair. My hand shakes when holding a cup of water. Pt has difficulty dressing and undressing, has to have help unhooking bra. Pt is unable to reach overhead or behind back.   FUNCTIONAL OUTCOME MEASURES: FOTO: 45/100  UPPER EXTREMITY ROM:       Assessed in sitting, er/IR adducted  Active ROM Right eval  Shoulder flexion 94  Shoulder abduction 103  Shoulder internal rotation 90  Shoulder external rotation 18  (Blank rows = not tested)  Assessed  in supine, er/IR adducted  Passive ROM Right eval  Shoulder flexion 110  Shoulder abduction 108  Shoulder internal rotation 90  Shoulder external rotation 20  (Blank rows = not tested)    UPPER EXTREMITY MMT:     Assessed in sitting, er/IR adducted  MMT Right eval  Shoulder flexion 3-/5  Shoulder abduction 3-/5  Shoulder internal rotation 3/5  Shoulder external rotation 3-/5  (Blank rows = not tested)  HAND FUNCTION: Grip strength: Right: 48 lbs; Left: 45 lbs  SENSATION: WFL  EDEMA: None  COGNITION: Overall cognitive status: Within functional limits for tasks assessed  OBSERVATIONS: mod fascial restrictions along right upper arm, trapezius, and scapular regions; degenerative creptius noted with mobility   TODAY'S TREATMENT:                                                                                                                              DATE:   12/12/23 -Myofascial release to right upper arm, anterior shoulder, trapezius, and scapular regions to decrease pain and fascial restrictions and increase joint ROM -P/ROM: supine-flexion, abduction, er, 5 reps  -AA/ROM: supine-protraction, flexion, horizontal abduction, er, 10 reps -AA/ROM: seated-abduction, protraction, flexion, horizontal abduction, er, 10 reps -Functional reaching: pt placing 10 cones on middle shelf of overhead cabinet in flexion, removing in abduction -Scapular theraband: red-row, extension, 10 reps  12/06/23 -AA/ROM: Supine-protraction, flexion, horizontal abduction, er, 10 reps - Scapular strengthening: red theraband, row, retraction 10x each  - Proximal shoulder strength: paddles, criss cross, circles and reverse circles 30 seconds 3 sets each  - UBE bike: 2 mins forwards 2 mins backwards 3.5 pace   Eval:  -AA/ROM: Supine-protraction, flexion, horizontal abduction, er, 10 reps -AA/ROM: sitting-protraction, abduction, 10 reps    PATIENT EDUCATION: Education details: reviewed  HEP Person educated: Patient and Child(ren) Education method: Explanation, Demonstration, and Handouts Education comprehension: verbalized understanding and returned demonstration  HOME EXERCISE PROGRAM: Carley Hammed: AA/ROM  GOALS: Goals reviewed with patient? Yes   SHORT TERM GOALS: Target date: 12/28/23  Pt will be provided with and educated on HEP to improve mobility in RUE required for use during ADL completion.   Goal status:  IN PROGRESS  2.  Pt will increase RUE A/ROM by 10 degrees to improve ability to use RUE during dressing tasks with minimal compensatory techniques.   Goal status: IN PROGRESS  3.  Pt will increase RUE strength to 3+/5 to improve ability to reach for items at waist to chest height during dressing, bathing, and grooming tasks.   Goal status: IN PROGRESS  4.  Pt will decrease pain in RUE to 3/10 or less to improve ability to perform grooming and bathing tasks with minimal discomfort.   Goal status: IN PROGRESS  5.  Pt will decrease RUE fascial restrictions to min amounts or less to improve mobility required for functional reaching tasks.   Goal status: IN PROGRESS   ASSESSMENT:  CLINICAL IMPRESSION: Pt reports that she has been completing her HEP but not diligently due to being busy with Christmas items. Completed manual techniques this session, passive stretching. Pt with pain at approximately 65% ROM, crepitus in all planes of mobility. Pt completing AA/ROM and functional reaching with improved ROM compared to evaluation. Added scapular theraband. Verbal cuing for form and technique.   PERFORMANCE DEFICITS: in functional skills including in functional skills including ADLs, IADLs, coordination, tone, ROM, strength, pain, fascial restrictions, muscle spasms, and UE functional use     PLAN:  OT FREQUENCY: 1x/week  OT DURATION: 4 weeks  PLANNED INTERVENTIONS: 97168 OT Re-evaluation, 97535 self care/ADL training, 09811 therapeutic exercise, 97530  therapeutic activity, 97140 manual therapy, 97014 electrical stimulation unattended, patient/family education, and DME and/or AE instructions  CONSULTED AND AGREED WITH PLAN OF CARE: Patient  PLAN FOR NEXT SESSION: Follow up on HEP, manual techniques, AA/ROM to A/ROM, functional reaching   [employer], OTR/L  [phone] 12/12/2023, 3:18 PM

## 2023-12-12 NOTE — Therapy (Signed)
[facility] OUTPATIENT PHYSICAL THERAPY TREATMENT    Patient Name: Karina Sawyer MRN: [id number] DOB:14-May-1956, 67 y.o., female Today's Date: 12/12/2023  END OF SESSION:   PT End of Session - 12/12/23 1305     Visit Number 2    Number of Visits 6    Authorization Type [employer]    PT Start Time 0100    PT Stop Time 0140    PT Time Calculation (min) 40 min    Activity Tolerance Patient tolerated treatment well    Behavior During Therapy [facility] for tasks assessed/performed              Past Medical History:  Diagnosis Date   Abnormal results of liver function studies    Acute maxillary sinusitis    around 2003   Arthritis    knees   Chest pain    Chest pain 2022   Per pt, she experienced a burning sensation in her chest and an abnormal EKG dated 04/26/21. She was sent to Dr. Anne Fu, cardiologist on 04/26/21. Echocardiogram demonstrated LVEF  55 - 60% and severe left atrial dilation. Patient states that she was told everything was fine, and that her chest pain was likely acid reflux.   Heart murmur    many years ago told had a murmur but no one recenctly mentioned   History of kidney stones 2003   Hyperlipidemia    Hypertension    Follows w/ PCP, Dr. Catalina Pizza takes HCTZ.   Hypothyroidism    Follows with PCP, Dr. Catalina Pizza. Currently taking Synthroid.   Menopausal and female climacteric states    Morbid obesity (HCC)    PONV (postoperative nausea and vomiting)    Patient states severe nausea and vomiting after anesthesia.   Wears glasses    reading only   Past Surgical History:  Procedure Laterality Date   APPENDECTOMY  1971   COLONOSCOPY  2015   [facility] - no polyps per pt   DILATATION & CURETTAGE/HYSTEROSCOPY WITH MYOSURE N/A 12/05/2022   Procedure: DILATATION & CURETTAGE/HYSTEROSCOPY WITH MYOSURE LITE;  Surgeon: Charlett Nose, MD;  Location: [facility];  Service: Gynecology;  Laterality: N/A;  Device needed = myosure lite   NECK  SURGERY     growth removed at birth   ovary removed     age 14   TUBAL LIGATION  08/24/1989   early 54's   WISDOM TOOTH EXTRACTION     many years ago   There are no active problems to display for this patient.   PCP: Benita Stabile, MDPCP - General   REFERRING PROVIDER: Sheral Apley, MDRef Provider   REFERRING DIAG: M17.11 (ICD-10-CM) - Osteoarthritis of right knee, unspecified osteoarthritis type   Rationale for Evaluation and Treatment: Rehabilitation  THERAPY DIAG:  No diagnosis found.  ONSET DATE: within 3 months --------------------------------------------------------------------------------------------- SUBJECTIVE:  SUBJECTIVE STATEMENT: Today: Patient reports using single point cane as recommended by PT has decreased her knee pain   IE: Within the last 3 months, patient's right knee pain has increased and has been popping. Cortisone shot to the right knee in October 2024. Patient has been referred to OPPT.   PERTINENT HISTORY:  Morbid obesity  PAIN:  Are you having pain? Yes: NPRS scale: 6-7/10 Pain location: medial right knee  Pain description: dully/achy Aggravating factors: walking Relieving factors: sitting  PRECAUTIONS: None  RED FLAGS: None   WEIGHT BEARING RESTRICTIONS: No  FALLS:  Has patient fallen in last 6 months? No  LIVING ENVIRONMENT: Lives with: lives alone Lives in: House/apartment Stairs: Yes: External: 2 steps; none Has following equipment at home: Single point cane and Walker - 2 wheeled  OCCUPATION: retired  PLOF: Independent  PATIENT GOALS: Patient wants to avoid right knee surgery   NEXT MD VISIT: TBD  --------------------------------------------------------------------------------------------- OBJECTIVE:   DIAGNOSTIC FINDINGS:   N/a  PATIENT SURVEYS:  LEFS 25/80  SCREENING FOR RED FLAGS: Bowel or bladder incontinence: No Spinal tumors: No Cauda equina syndrome: No Compression fracture: No Abdominal aneurysm: No  COGNITION: Overall cognitive status: Within functional limits for tasks assessed  POSTURE: No Significant postural limitations      FUNCTIONAL TESTS:  5 times sit to stand: 19.23s with upper extremity assist  2 minute walk test: next session   GAIT ANALYSIS: Distance walked: 33ft Assistive device utilized: None Level of assistance: Complete Independence Comments: Patient with moderate right lower extremity antalgia  SENSATION: WFL    LOWER EXTREMITY MMT:    MMT Right eval Left eval  Hip flexion 3/5   Hip extension    Hip abduction    Hip adduction    Hip internal rotation    Hip external rotation    Knee flexion 3-/5 4-/5  Knee extension 3+/5 4-/5  Ankle dorsiflexion    Ankle plantarflexion    Ankle inversion    Ankle eversion     (Blank rows = not tested)  LOWER EXTREMITY ROM:     Active  Right eval Left eval  Hip flexion    Hip extension    Hip abduction    Hip adduction    Hip internal rotation    Hip external rotation    Knee flexion 0-80 0-90  Knee extension    Ankle dorsiflexion    Ankle plantarflexion    Ankle inversion    Ankle eversion     (Blank rows = not tested)  LOWER EXTREMITY SPECIAL TESTS Patellafemoral grind test: positive      PALPATION: Moderate tenderness to palpation right medial knee Moderate hypermobility patella with lateral tracking  INTEGUMENTARY   WFL --------------------------------------------------------------------------------------------- TODAY'S TREATMENT:                                                                                                                              DATE:  12/12/23 Supine  Manual  therapy to right knee: soft tissue mobilization to knee muscles, joint mobilizations grade 2-3 for knee  extension/flexion ROM, PROM of knee, mobilization with movement into knee flexion  SAQs with 5# then #8 x10 each Seated  Leg curls with 40# x10   Leg press with 40# 2x10  NuStep L3 cool down   12/06/23 Supine  Manual therapy to right knee: soft tissue mobilization to knee muscles, joint mobilizations grade 2-3 for knee extension/flexion ROM, PROM of knee, mobilization with movement into knee flexion Heel slides + strap 10x3"H  SAQs with 2# 2x10 LAQs with 2# 2x10       11/28/23 Gait Training with single point cane  PT initial eval     PATIENT EDUCATION:  Education details: HEP Person educated: Patient Education method: Explanation Education comprehension: verbalized understanding  HOME EXERCISE PROGRAM: Access Code: [email or web address] URL: [email or web address] Date: 12/06/2023 Prepared by: Seymour Bars  Exercises - Supine Quad Set  - 1-2 x daily - 2 sets - 10 reps - Supine Heel Slide with Strap (Mirrored)  - 1-2 x daily - 2 sets - 10 reps - Sitting Knee Extension with Resistance  - 1-2 x daily - 7 x weekly - 2 sets - 10 reps  Patient Education - Reciprocal Gait with Cane --------------------------------------------------------------------------------------------- ASSESSMENT:  CLINICAL IMPRESSION:  PT began session with manual therapy to decrease pain, improve ROM to right knee. Patient tolerates manual therapy well; had (2) knee extensions prior to manual therapy. PT then progressed Quad Strength using machines with resistance; tolerated well with no pain. PT updated HEP to improve lower extremity strength. Patient will continue to benefit form PT to return to prior level of function   IE: Patient is a 67 y.o. y.o. female who was seen today for physical therapy evaluation and treatment for right knee pain, weakness, difficulty walking. Patient presents to PT with the following objective impairments: Abnormal gait, decreased activity tolerance, decreased mobility,  difficulty walking, decreased ROM, decreased strength, improper body mechanics, obesity, and pain. These impairments limit the patient in activities such as carrying, lifting, bending, standing, squatting, stairs, transfers, and locomotion level. These impairments also limit the patient in participation such as meal prep, cleaning, laundry, interpersonal relationship, driving, shopping, community activity, and yard work. The patient will benefit from PT to address the limitations/impairments listed below to return to their prior level of function in the domains of activity and participation.    PERSONAL FACTORS:  obesity  are also affecting patient's functional outcome.   REHAB POTENTIAL: Good  CLINICAL DECISION MAKING: Stable/uncomplicated  EVALUATION COMPLEXITY: Low  --------------------------------------------------------------------------------------------- GOALS: Goals reviewed with patient? No  SHORT TERM GOALS: Target date: 12/19/2023   1. Patient will be independent with a basic stretching/strengthening HEP  Baseline:  Goal status: INITIAL 2. Patient will be able to demonstrate right knee flexion active range of motion to 0-85 degrees to facilitate ADL completion, lifting, squatting. Baseline:  Goal status: INITIAL   LONG TERM GOALS: Target date: 01/09/2024    Patient will score a >/= 35/80 on the LEFS    to demonstrate a Minimally Clinically Importance Difference (MCID) in ADL completion, home/community ambulation, and lifting/bending/squatting. Baseline:  Goal status: INITIAL  2.  Patient will complete the 5 times sit to stand:    within 16s to demonstrate an improvement in ADL completion, stair negotiation, household/community ambulation, and self-care Baseline:  Goal status: INITIAL  3.  Patient will be independent with a comprehensive strengthening HEP  Baseline:  Goal status: INITIAL  4. Patient  will be able to demonstrate right knee flexion active range of  motion to 0-100 degrees to facilitate ADL completion, lifting, squatting. Baseline:  Goal status: INITIAL  --------------------------------------------------------------------------------------------- PLAN:  PT FREQUENCY: 1x/week  PT DURATION: 6 weeks  PLANNED INTERVENTIONS: 97110-Therapeutic exercises, 97530- Therapeutic activity, 97112- Neuromuscular re-education, 97535- Self Care, 40981- Manual therapy, [phone]- Gait training, Patient/Family education, Balance training, Stair training, Taping, Dry Needling, Joint mobilization, Joint manipulation, Spinal manipulation, Spinal mobilization, DME instructions, Cryotherapy, and Moist heat.  PLAN FOR NEXT SESSION: Progress lower extremity strengthening as tolerated    Seymour Bars, PT 12/12/2023, 1:09 PM

## 2023-12-19 ENCOUNTER — Ambulatory Visit (HOSPITAL_COMMUNITY): Payer: HMO | Admitting: Occupational Therapy

## 2023-12-19 ENCOUNTER — Encounter (HOSPITAL_COMMUNITY): Payer: Self-pay | Admitting: Occupational Therapy

## 2023-12-19 ENCOUNTER — Ambulatory Visit (HOSPITAL_COMMUNITY): Payer: HMO

## 2023-12-19 DIAGNOSIS — R29898 Other symptoms and signs involving the musculoskeletal system: Secondary | ICD-10-CM

## 2023-12-19 DIAGNOSIS — M25561 Pain in right knee: Secondary | ICD-10-CM | POA: Diagnosis not present

## 2023-12-19 DIAGNOSIS — M25611 Stiffness of right shoulder, not elsewhere classified: Secondary | ICD-10-CM

## 2023-12-19 DIAGNOSIS — G8929 Other chronic pain: Secondary | ICD-10-CM

## 2023-12-19 DIAGNOSIS — R262 Difficulty in walking, not elsewhere classified: Secondary | ICD-10-CM

## 2023-12-19 DIAGNOSIS — M6281 Muscle weakness (generalized): Secondary | ICD-10-CM

## 2023-12-19 NOTE — Therapy (Signed)
OUTPATIENT OCCUPATIONAL THERAPY ORTHO TREATMENT  Patient Name: Karina Sawyer MRN: [id number] DOB:[birth year], 67 y.o., female Today's Date: 12/19/2023   END OF SESSION:  OT End of Session - 12/19/23 1058     Visit Number 4    Number of Visits 5    Date for OT Re-Evaluation 12/28/23    Authorization Type [employer]    Progress Note Due on Visit 10    OT Start Time 1019    OT Stop Time 1100    OT Time Calculation (min) 41 min    Activity Tolerance Patient tolerated treatment well    Behavior During Therapy [facility] for tasks assessed/performed                Past Medical History:  Diagnosis Date   Abnormal results of liver function studies    Acute maxillary sinusitis    around 2003   Arthritis    knees   Chest pain    Chest pain 2022   Per pt, she experienced a burning sensation in her chest and an abnormal EKG dated 04/26/21. She was sent to Dr. Anne Fu, cardiologist on 04/26/21. Echocardiogram demonstrated LVEF  55 - 60% and severe left atrial dilation. Patient states that she was told everything was fine, and that her chest pain was likely acid reflux.   Heart murmur    many years ago told had a murmur but no one recenctly mentioned   History of kidney stones 2003   Hyperlipidemia    Hypertension    Follows w/ PCP, Dr. Catalina Pizza takes HCTZ.   Hypothyroidism    Follows with PCP, Dr. Catalina Pizza. Currently taking Synthroid.   Menopausal and female climacteric states    Morbid obesity (HCC)    PONV (postoperative nausea and vomiting)    Patient states severe nausea and vomiting after anesthesia.   Wears glasses    reading only   Past Surgical History:  Procedure Laterality Date   APPENDECTOMY  1971   COLONOSCOPY  2015   [facility] - no polyps per pt   DILATATION & CURETTAGE/HYSTEROSCOPY WITH MYOSURE N/A 12/05/2022   Procedure: DILATATION & CURETTAGE/HYSTEROSCOPY WITH MYOSURE LITE;  Surgeon: Charlett Nose, MD;  Location: [facility] [place];   Service: Gynecology;  Laterality: N/A;  Device needed = myosure lite   NECK SURGERY     growth removed at birth   ovary removed     age 78   TUBAL LIGATION  08/24/1989   early 48's   WISDOM TOOTH EXTRACTION     many years ago   There are no active problems to display for this patient.   PCP: Dr. Nita Sells REFERRING PROVIDER: Dr. Margarita Rana  ONSET DATE: several months  REFERRING DIAG: M19.011 (ICD-10-CM) - Osteoarthritis of right shoulder, unspecified osteoarthritis type   THERAPY DIAG:  Chronic right shoulder pain  Other symptoms and signs involving the musculoskeletal system  Stiffness of right shoulder, not elsewhere classified  Rationale for Evaluation and Treatment: Rehabilitation  SUBJECTIVE:   SUBJECTIVE STATEMENT: S: "I just have to be careful with lifting things."   PERTINENT HISTORY: Pt is a 67 y/o female presenting with chronic right shoulder pain. Pt has received several injections, 2 in the last month one of which was US guided, with some relief. Pt would like to try conservative treatment before opting for TSA.   PRECAUTIONS: None  WEIGHT BEARING RESTRICTIONS: No  PAIN:  Are you having pain? No  FALLS: Has patient fallen  in last 6 months? No  PLOF: Independent  PATIENT GOALS: To be able to move my arm more.   NEXT MD VISIT: 12/2023  OBJECTIVE:   HAND DOMINANCE: Right  ADLs: Overall ADLs: I can't hardly brush my teeth or brush my hair. My hand shakes when holding a cup of water. Pt has difficulty dressing and undressing, has to have help unhooking bra. Pt is unable to reach overhead or behind back.   FUNCTIONAL OUTCOME MEASURES: FOTO: 45/100  UPPER EXTREMITY ROM:       Assessed in sitting, er/IR adducted  Active ROM Right eval  Shoulder flexion 94  Shoulder abduction 103  Shoulder internal rotation 90  Shoulder external rotation 18  (Blank rows = not tested)  Assessed in supine, er/IR adducted  Passive ROM Right eval   Shoulder flexion 110  Shoulder abduction 108  Shoulder internal rotation 90  Shoulder external rotation 20  (Blank rows = not tested)    UPPER EXTREMITY MMT:     Assessed in sitting, er/IR adducted  MMT Right eval  Shoulder flexion 3-/5  Shoulder abduction 3-/5  Shoulder internal rotation 3/5  Shoulder external rotation 3-/5  (Blank rows = not tested)  HAND FUNCTION: Grip strength: Right: 48 lbs; Left: 45 lbs  OBSERVATIONS: mod fascial restrictions along right upper arm, trapezius, and scapular regions; degenerative creptius noted with mobility   TODAY'S TREATMENT:                                                                                                                              DATE:  12/19/23 -Myofascial release to right upper arm, anterior shoulder, trapezius, and scapular regions to decrease pain and fascial restrictions and increase joint ROM -P/ROM: supine-flexion, abduction, er, 5 reps  -AA/ROM: supine-protraction, flexion, horizontal abduction, er, 10 reps -Isometrics: standing-flexion, extension, abduction, er, IR, 3x15" holds -Wall wash: 1' flexion -Scapular theraband: red-row, extension, 10 reps -Functional reaching: pt placing 5 cones on middle shelf of overhead cabinet in flexion, removing in abduction - UBE bike: level 2, 3' forwards 2' reverse 6.0 pace   12/12/23 -Myofascial release to right upper arm, anterior shoulder, trapezius, and scapular regions to decrease pain and fascial restrictions and increase joint ROM -P/ROM: supine-flexion, abduction, er, 5 reps  -AA/ROM: supine-protraction, flexion, horizontal abduction, er, 10 reps -AA/ROM: seated-abduction, protraction, flexion, horizontal abduction, er, 10 reps -Functional reaching: pt placing 10 cones on middle shelf of overhead cabinet in flexion, removing in abduction -Scapular theraband: red-row, extension, 10 reps  12/06/23 -AA/ROM: Supine-protraction, flexion, horizontal abduction, er,  10 reps - Scapular strengthening: red theraband, row, retraction 10x each  - Proximal shoulder strength: paddles, criss cross, circles and reverse circles 30 seconds 3 sets each  - UBE bike: 2 mins forwards 2 mins backwards 3.5 pace     PATIENT EDUCATION: Education details: reviewed HEP Person educated: Patient and Child(ren) Education method: Explanation, Demonstration, and Handouts Education comprehension: verbalized understanding and returned demonstration  HOME EXERCISE PROGRAM: Carley Hammed: AA/ROM  GOALS: Goals reviewed with patient? Yes   SHORT TERM GOALS: Target date: 12/28/23  Pt will be provided with and educated on HEP to improve mobility in RUE required for use during ADL completion.   Goal status: IN PROGRESS  2.  Pt will increase RUE A/ROM by 10 degrees to improve ability to use RUE during dressing tasks with minimal compensatory techniques.   Goal status: IN PROGRESS  3.  Pt will increase RUE strength to 3+/5 to improve ability to reach for items at waist to chest height during dressing, bathing, and grooming tasks.   Goal status: IN PROGRESS  4.  Pt will decrease pain in RUE to 3/10 or less to improve ability to perform grooming and bathing tasks with minimal discomfort.   Goal status: IN PROGRESS  5.  Pt will decrease RUE fascial restrictions to min amounts or less to improve mobility required for functional reaching tasks.   Goal status: IN PROGRESS   ASSESSMENT:  CLINICAL IMPRESSION: Pt reports that she has been busy over the holiday. Continued with manual techniques, with passive stretching pt with moderate crepitus and pain at approximately 50% ROM, limited to pt tolerance. Continued with AA/ROM in supine, added isometrics today with pt reporting she can feel the muscles engaging during task. Pt with less pain during functional reaching today compared to previous session.  Verbal cuing for form and technique.   PERFORMANCE DEFICITS: in functional skills  including in functional skills including ADLs, IADLs, coordination, tone, ROM, strength, pain, fascial restrictions, muscle spasms, and UE functional use   PLAN:  OT FREQUENCY: 1x/week  OT DURATION: 4 weeks  PLANNED INTERVENTIONS: 97168 OT Re-evaluation, 97535 self care/ADL training, 13244 therapeutic exercise, 97530 therapeutic activity, 97140 manual therapy, 97014 electrical stimulation unattended, patient/family education, and DME and/or AE instructions  CONSULTED AND AGREED WITH PLAN OF CARE: Patient  PLAN FOR NEXT SESSION: Follow up on HEP, manual techniques, AA/ROM to A/ROM, functional reaching; update HEP for scapular theraband   Ezra Sites, OTR/L  [phone] 12/19/2023, 11:01 AM

## 2023-12-19 NOTE — Therapy (Signed)
[facility] OUTPATIENT PHYSICAL THERAPY TREATMENT    Patient Name: Karina Sawyer MRN: [id number] DOB:10/01/1956, 67 y.o., female Today's Date: 12/19/2023  END OF SESSION:   PT End of Session - 12/19/23 1003     Visit Number 3    Number of Visits 6    Authorization Type [employer]    PT Start Time 0935    PT Stop Time 1015    PT Time Calculation (min) 40 min    Activity Tolerance Patient tolerated treatment well    Behavior During Therapy [facility] for tasks assessed/performed            Past Medical History:  Diagnosis Date   Abnormal results of liver function studies    Acute maxillary sinusitis    around 2003   Arthritis    knees   Chest pain    Chest pain 2022   Per pt, she experienced a burning sensation in her chest and an abnormal EKG dated 04/26/21. She was sent to Dr. Anne Fu, cardiologist on 04/26/21. Echocardiogram demonstrated LVEF  55 - 60% and severe left atrial dilation. Patient states that she was told everything was fine, and that her chest pain was likely acid reflux.   Heart murmur    many years ago told had a murmur but no one recenctly mentioned   History of kidney stones 2003   Hyperlipidemia    Hypertension    Follows w/ PCP, Dr. Catalina Pizza takes HCTZ.   Hypothyroidism    Follows with PCP, Dr. Catalina Pizza. Currently taking Synthroid.   Menopausal and female climacteric states    Morbid obesity (HCC)    PONV (postoperative nausea and vomiting)    Patient states severe nausea and vomiting after anesthesia.   Wears glasses    reading only   Past Surgical History:  Procedure Laterality Date   APPENDECTOMY  1971   COLONOSCOPY  2015   [facility] - no polyps per pt   DILATATION & CURETTAGE/HYSTEROSCOPY WITH MYOSURE N/A 12/05/2022   Procedure: DILATATION & CURETTAGE/HYSTEROSCOPY WITH MYOSURE LITE;  Surgeon: Charlett Nose, MD;  Location: [facility] [place];  Service: Gynecology;  Laterality: N/A;  Device needed = myosure lite   NECK SURGERY      growth removed at birth   ovary removed     age 44   TUBAL LIGATION  08/24/1989   early 17's   WISDOM TOOTH EXTRACTION     many years ago   There are no active problems to display for this patient.   PCP: Benita Stabile, MDPCP - General   REFERRING PROVIDER: Sheral Apley, MDRef Provider   REFERRING DIAG: M17.11 (ICD-10-CM) - Osteoarthritis of right knee, unspecified osteoarthritis type   Rationale for Evaluation and Treatment: Rehabilitation  THERAPY DIAG:  Right knee pain, unspecified chronicity  Muscle weakness (generalized)  Difficulty in walking, not elsewhere classified  Other symptoms and signs involving the musculoskeletal system  ONSET DATE: within 3 months --------------------------------------------------------------------------------------------- SUBJECTIVE:  SUBJECTIVE STATEMENT: Patient reports of no knee pain but patient reports that she doesn't walk that far so as not to aggravate the knee.  IE: Within the last 3 months, patient's right knee pain has increased and has been popping. Cortisone shot to the right knee in October 2024. Patient has been referred to OPPT.   PERTINENT HISTORY:  Morbid obesity  PAIN:  Are you having pain? Yes: NPRS scale: 6-7/10 Pain location: medial right knee  Pain description: dully/achy Aggravating factors: walking Relieving factors: sitting  PRECAUTIONS: None  RED FLAGS: None   WEIGHT BEARING RESTRICTIONS: No  FALLS:  Has patient fallen in last 6 months? No  LIVING ENVIRONMENT: Lives with: lives alone Lives in: House/apartment Stairs: Yes: External: 2 steps; none Has following equipment at home: Single point cane and Walker - 2 wheeled  OCCUPATION: retired  PLOF: Independent  PATIENT GOALS: Patient wants to avoid right  knee surgery   NEXT MD VISIT: TBD  --------------------------------------------------------------------------------------------- OBJECTIVE:   DIAGNOSTIC FINDINGS:  N/a  PATIENT SURVEYS:  LEFS 25/80  SCREENING FOR RED FLAGS: Bowel or bladder incontinence: No Spinal tumors: No Cauda equina syndrome: No Compression fracture: No Abdominal aneurysm: No  COGNITION: Overall cognitive status: Within functional limits for tasks assessed  POSTURE: No Significant postural limitations      FUNCTIONAL TESTS:  5 times sit to stand: 19.23s with upper extremity assist  2 minute walk test: next session   GAIT ANALYSIS: Distance walked: 37ft Assistive device utilized: None Level of assistance: Complete Independence Comments: Patient with moderate right lower extremity antalgia  SENSATION: WFL    LOWER EXTREMITY MMT:    MMT Right eval Left eval  Hip flexion 3/5   Hip extension    Hip abduction    Hip adduction    Hip internal rotation    Hip external rotation    Knee flexion 3-/5 4-/5  Knee extension 3+/5 4-/5  Ankle dorsiflexion    Ankle plantarflexion    Ankle inversion    Ankle eversion     (Blank rows = not tested)  LOWER EXTREMITY ROM:     Active  Right eval Left eval  Hip flexion    Hip extension    Hip abduction    Hip adduction    Hip internal rotation    Hip external rotation    Knee flexion 0-80 0-90  Knee extension    Ankle dorsiflexion    Ankle plantarflexion    Ankle inversion    Ankle eversion     (Blank rows = not tested)  LOWER EXTREMITY SPECIAL TESTS Patellafemoral grind test: positive      PALPATION: Moderate tenderness to palpation right medial knee Moderate hypermobility patella with lateral tracking  INTEGUMENTARY   WFL --------------------------------------------------------------------------------------------- TODAY'S TREATMENT:  DATE: 12/19/23 Recumbent bike, seat 13, level 1, rocking x 3' Gr 2 AP/PA tibiofemoral glide x 30" x 3 each Gr 2 patellar mob on all planes x 2' total Heel slides with a strap x 10 x 3 x 30" hold at the beginning of each set  Supine passive R piriformis stretch x 30" x 2 Supine R piriformis stretch with a strap x 30"  Seated adductor squeeze x 3" x 10 x 2  12/12/23 Supine  Manual therapy to right knee: soft tissue mobilization to knee muscles, joint mobilizations grade 2-3 for knee extension/flexion ROM, PROM of knee, mobilization with movement into knee flexion  SAQs with 5# then #8 x10 each Seated  Leg curls with 40# x10   Leg press with 40# 2x10  NuStep L3 cool down   12/06/23 Supine  Manual therapy to right knee: soft tissue mobilization to knee muscles, joint mobilizations grade 2-3 for knee extension/flexion ROM, PROM of knee, mobilization with movement into knee flexion Heel slides + strap 10x3"H  SAQs with 2# 2x10 LAQs with 2# 2x10       11/28/23 Gait Training with single point cane  PT initial eval     PATIENT EDUCATION:  Education details: HEP Person educated: Patient Education method: Explanation Education comprehension: verbalized understanding  HOME EXERCISE PROGRAM: Access Code: [email or web address] URL: [email or web address] 12/[email or web address]/23 - Supine Heel Slide with Strap (Mirrored)  - 1-2 x daily - 5-7 x weekly - 2 sets - 10 reps (with 30 sec hold at the beginning of each set) - Sidelying Piriformis Stretch  - 1-2 x daily - 5-7 x weekly - 3 reps - 30 hold - Seated Hip Adduction Isometrics with Ball  - 1-2 x daily - 5-7 x weekly - 2 sets - 10 reps - 3 hold Date: 12/06/2023 Prepared by: Seymour Bars  Exercises - Supine Quad Set  - 1-2 x daily - 2 sets - 10 reps - Supine Heel Slide with Strap (Mirrored)  - 1-2 x daily - 2 sets - 10 reps - Sitting Knee Extension with Resistance  - 1-2 x daily - 7 x weekly - 2 sets - 10  reps  Patient Education - Paediatric nurse with Cane --------------------------------------------------------------------------------------------- ASSESSMENT:  CLINICAL IMPRESSION:  Interventions today were geared towards LE strengthening (correcting knee varus) and mobility. Tolerated all activities without worsening of symptoms except when doing heel slides where patient reported of some hamstring cramps which subsided. Noted mild to mod difficulty with heel slides due to decreased joint play and muscle length. Demonstrated appropriate levels of fatigue. Provided mild amount of cueing to ensure correct execution of activity with good carry-over. To date, skilled PT is required to address the impairments and improve function.   IE: Patient is a 67 y.o. y.o. female who was seen today for physical therapy evaluation and treatment for right knee pain, weakness, difficulty walking. Patient presents to PT with the following objective impairments: Abnormal gait, decreased activity tolerance, decreased mobility, difficulty walking, decreased ROM, decreased strength, improper body mechanics, obesity, and pain. These impairments limit the patient in activities such as carrying, lifting, bending, standing, squatting, stairs, transfers, and locomotion level. These impairments also limit the patient in participation such as meal prep, cleaning, laundry, interpersonal relationship, driving, shopping, community activity, and yard work. The patient will benefit from PT to address the limitations/impairments listed below to return to their prior level of function in the domains of activity and participation.    PERSONAL FACTORS:  obesity  are also affecting  patient's functional outcome.   REHAB POTENTIAL: Good  CLINICAL DECISION MAKING: Stable/uncomplicated  EVALUATION COMPLEXITY: Low  --------------------------------------------------------------------------------------------- GOALS: Goals reviewed with  patient? No  SHORT TERM GOALS: Target date: 12/19/2023   1. Patient will be independent with a basic stretching/strengthening HEP  Baseline:  Goal status: INITIAL 2. Patient will be able to demonstrate right knee flexion active range of motion to 0-85 degrees to facilitate ADL completion, lifting, squatting. Baseline:  Goal status: INITIAL   LONG TERM GOALS: Target date: 01/09/2024    Patient will score a >/= 35/80 on the LEFS    to demonstrate a Minimally Clinically Importance Difference (MCID) in ADL completion, home/community ambulation, and lifting/bending/squatting. Baseline:  Goal status: INITIAL  2.  Patient will complete the 5 times sit to stand:    within 16s to demonstrate an improvement in ADL completion, stair negotiation, household/community ambulation, and self-care Baseline:  Goal status: INITIAL  3.  Patient will be independent with a comprehensive strengthening HEP  Baseline:  Goal status: INITIAL  4. Patient will be able to demonstrate right knee flexion active range of motion to 0-100 degrees to facilitate ADL completion, lifting, squatting. Baseline:  Goal status: INITIAL  --------------------------------------------------------------------------------------------- PLAN:  PT FREQUENCY: 1x/week  PT DURATION: 6 weeks  PLANNED INTERVENTIONS: 97110-Therapeutic exercises, 97530- Therapeutic activity, 97112- Neuromuscular re-education, 97535- Self Care, 16109- Manual therapy, [phone]- Gait training, Patient/Family education, Balance training, Stair training, Taping, Dry Needling, Joint mobilization, Joint manipulation, Spinal manipulation, Spinal mobilization, DME instructions, Cryotherapy, and Moist heat.  PLAN FOR NEXT SESSION: Progress lower extremity strengthening as tolerated    Iantha Fallen L. Jun Osment, PT, DPT, OCS Board-Certified Clinical Specialist in Orthopedic PT PT Compact Privilege # ([place]): [id number] T 12/19/2023, 10:04 AM

## 2023-12-26 ENCOUNTER — Encounter (HOSPITAL_COMMUNITY): Payer: HMO | Admitting: Occupational Therapy

## 2023-12-26 ENCOUNTER — Ambulatory Visit (HOSPITAL_COMMUNITY): Payer: HMO | Admitting: Occupational Therapy

## 2023-12-26 ENCOUNTER — Encounter (HOSPITAL_COMMUNITY): Payer: Self-pay | Admitting: Occupational Therapy

## 2023-12-26 ENCOUNTER — Ambulatory Visit (HOSPITAL_COMMUNITY): Payer: HMO | Attending: Orthopedic Surgery

## 2023-12-26 DIAGNOSIS — M25611 Stiffness of right shoulder, not elsewhere classified: Secondary | ICD-10-CM | POA: Insufficient documentation

## 2023-12-26 DIAGNOSIS — M25511 Pain in right shoulder: Secondary | ICD-10-CM | POA: Diagnosis not present

## 2023-12-26 DIAGNOSIS — G8929 Other chronic pain: Secondary | ICD-10-CM

## 2023-12-26 DIAGNOSIS — M25561 Pain in right knee: Secondary | ICD-10-CM | POA: Diagnosis not present

## 2023-12-26 DIAGNOSIS — R262 Difficulty in walking, not elsewhere classified: Secondary | ICD-10-CM | POA: Insufficient documentation

## 2023-12-26 DIAGNOSIS — R29898 Other symptoms and signs involving the musculoskeletal system: Secondary | ICD-10-CM | POA: Diagnosis not present

## 2023-12-26 DIAGNOSIS — M6281 Muscle weakness (generalized): Secondary | ICD-10-CM | POA: Insufficient documentation

## 2023-12-26 NOTE — Therapy (Signed)
 OUTPATIENT OCCUPATIONAL THERAPY ORTHO TREATMENT REASSESSMENT AND RECERTIFICATION  Patient Name: Karina Sawyer MRN: [id number] DOB:1956-05-06, 68 y.o., female Today's Date: 12/26/2023   END OF SESSION:  OT End of Session - 12/26/23 1512     Visit Number 5    Number of Visits 9    Date for OT Re-Evaluation 01/25/24    Authorization Type [employer]    Progress Note Due on Visit 10    OT Start Time 1344    OT Stop Time 1426    OT Time Calculation (min) 42 min    Activity Tolerance Patient tolerated treatment well    Behavior During Therapy [facility] for tasks assessed/performed                 Past Medical History:  Diagnosis Date   Abnormal results of liver function studies    Acute maxillary sinusitis    around 2003   Arthritis    knees   Chest pain    Chest pain 2022   Per pt, she experienced a burning sensation in her chest and an abnormal EKG dated 04/26/21. She was sent to Dr. Jeffrie, cardiologist on 04/26/21. Echocardiogram demonstrated LVEF  55 - 60% and severe left atrial dilation. Patient states that she was told everything was fine, and that her chest pain was likely acid reflux.   Heart murmur    many years ago told had a murmur but no one recenctly mentioned   History of kidney stones 2003   Hyperlipidemia    Hypertension    Follows w/ PCP, Dr. Darlyn Hurst takes HCTZ.   Hypothyroidism    Follows with PCP, Dr. Darlyn Hurst. Currently taking Synthroid.   Menopausal and female climacteric states    Morbid obesity (HCC)    PONV (postoperative nausea and vomiting)    Patient states severe nausea and vomiting after anesthesia.   Wears glasses    reading only   Past Surgical History:  Procedure Laterality Date   APPENDECTOMY  1971   COLONOSCOPY  2015   [facility] - no polyps per pt   DILATATION & CURETTAGE/HYSTEROSCOPY WITH MYOSURE N/A 12/05/2022   Procedure: DILATATION & CURETTAGE/HYSTEROSCOPY WITH MYOSURE LITE;  Surgeon: Diedre Rosaline BRAVO, MD;   Location: [facility];  Service: Gynecology;  Laterality: N/A;  Device needed = myosure lite   NECK SURGERY     growth removed at birth   ovary removed     age 21   TUBAL LIGATION  08/24/1989   early 55's   WISDOM TOOTH EXTRACTION     many years ago   There are no active problems to display for this patient.   PCP: Dr. Norleen Hurst REFERRING PROVIDER: Dr. Evalene Chancy  ONSET DATE: several months  REFERRING DIAG: M19.011 (ICD-10-CM) - Osteoarthritis of right shoulder, unspecified osteoarthritis type   THERAPY DIAG:  Other symptoms and signs involving the musculoskeletal system  Chronic right shoulder pain  Stiffness of right shoulder, not elsewhere classified  Rationale for Evaluation and Treatment: Rehabilitation  SUBJECTIVE:   SUBJECTIVE STATEMENT: S: I can reach my head now.   PERTINENT HISTORY: Pt is a 68 y/o female presenting with chronic right shoulder pain. Pt has received several injections, 2 in the last month one of which was US  guided, with some relief. Pt would like to try conservative treatment before opting for TSA.   PRECAUTIONS: None  WEIGHT BEARING RESTRICTIONS: No  PAIN:  Are you having pain? No  FALLS: Has patient  fallen in last 6 months? No  PLOF: Independent  PATIENT GOALS: To be able to move my arm more.   NEXT MD VISIT: 01/01/2024  OBJECTIVE:   HAND DOMINANCE: Right  ADLs: Overall ADLs: I can't hardly brush my teeth or brush my hair. My hand shakes when holding a cup of water. Pt has difficulty dressing and undressing, has to have help unhooking bra. Pt is unable to reach overhead or behind back.   FUNCTIONAL OUTCOME MEASURES: FOTO: 45/100 12/26/23: 56/100  UPPER EXTREMITY ROM:       Assessed in sitting, er/IR adducted  Active ROM Right eval Right 12/26/23  Shoulder flexion 94 115  Shoulder abduction 103 123  Shoulder internal rotation 90 90  Shoulder external rotation 18 25  (Blank rows = not tested)  Assessed  in supine, er/IR adducted  Passive ROM Right eval Right 12/26/23  Shoulder flexion 110 132  Shoulder abduction 108 127  Shoulder internal rotation 90 90  Shoulder external rotation 20 20  (Blank rows = not tested)    UPPER EXTREMITY MMT:     Assessed in sitting, er/IR adducted  MMT Right eval Right 12/26/23  Shoulder flexion 3-/5 4/5  Shoulder abduction 3-/5 3+/5  Shoulder internal rotation 3/5 5/5  Shoulder external rotation 3-/5 4-/5  (Blank rows = not tested)  HAND FUNCTION: Grip strength: Right: 48 lbs; Left: 45 lbs  OBSERVATIONS: mod fascial restrictions along right upper arm, trapezius, and scapular regions; degenerative creptius noted with mobility   TODAY'S TREATMENT:                                                                                                                              DATE:   12/26/23 -Myofascial release to right upper arm, anterior shoulder, trapezius, and scapular regions to decrease pain and fascial restrictions and increase joint ROM -P/ROM: supine-flexion, abduction, er, 5 reps  -A/ROM: seated-protraction, flexion, horizontal abduction, er/IR, abduction, 10 reps -Wall wash: 1' flexion -Functional reaching: pt placing 10 cones on middle shelf of overhead cabinet in flexion then removing, repeating in abduction  12/19/23 -Myofascial release to right upper arm, anterior shoulder, trapezius, and scapular regions to decrease pain and fascial restrictions and increase joint ROM -P/ROM: supine-flexion, abduction, er, 5 reps  -AA/ROM: supine-protraction, flexion, horizontal abduction, er, 10 reps -Isometrics: standing-flexion, extension, abduction, er, IR, 3x15 holds -Wall wash: 1' flexion -Scapular theraband: red-row, extension, 10 reps -Functional reaching: pt placing 5 cones on middle shelf of overhead cabinet in flexion, removing in abduction - UBE bike: level 2, 3' forwards 2' reverse 6.0 pace   12/12/23 -Myofascial release to right  upper arm, anterior shoulder, trapezius, and scapular regions to decrease pain and fascial restrictions and increase joint ROM -P/ROM: supine-flexion, abduction, er, 5 reps  -AA/ROM: supine-protraction, flexion, horizontal abduction, er, 10 reps -AA/ROM: seated-abduction, protraction, flexion, horizontal abduction, er, 10 reps -Functional reaching: pt placing 10 cones on middle shelf of overhead cabinet in flexion, removing  in abduction -Scapular theraband: red-row, extension, 10 reps    PATIENT EDUCATION: Education details: reviewed HEP Person educated: Patient and Child(ren) Education method: Explanation, Demonstration, and Handouts Education comprehension: verbalized understanding and returned demonstration  HOME EXERCISE PROGRAM: Elyn: AA/ROM 12/26/23: A/ROM  GOALS: Goals reviewed with patient? Yes   SHORT TERM GOALS: Target date: 12/28/23  Pt will be provided with and educated on HEP to improve mobility in RUE required for use during ADL completion.   Goal status: IN PROGRESS  2.  Pt will increase RUE A/ROM by 25 degrees to improve ability to use RUE during dressing tasks with minimal compensatory techniques.   Goal status: REVISED  3.  Pt will increase RUE strength to 4/5 to improve ability to reach for items at waist to chest height during dressing, bathing, and grooming tasks.   Goal status: REVISED  4.  Pt will decrease pain in RUE to 3/10 or less to improve ability to perform grooming and bathing tasks with minimal discomfort.   Goal status: MET  5.  Pt will decrease RUE fascial restrictions to min amounts or less to improve mobility required for functional reaching tasks.   Goal status: IN PROGRESS   ASSESSMENT:  CLINICAL IMPRESSION: Reassessment completed this session, pt has met 3/5 goals, 2 goals have been upgraded. Pt reports everything is getting easier, she is able to reach her hair now and has been taking dishes out of the cabinet. Pt with improving ROM  and strength, continues to have crepitus above 50% ROM, more with horizontal abduction. Progressed to A/ROM today and updated HEP. Pt continued with functional reaching task increasing to 10 cones and completing in flexion and abduction. Pt agreeable to 4 additional weeks of therapy to target additional gains in ROM and strength of RUE.   PERFORMANCE DEFICITS: in functional skills including in functional skills including ADLs, IADLs, coordination, tone, ROM, strength, pain, fascial restrictions, muscle spasms, and UE functional use   PLAN:  OT FREQUENCY: 1x/week  OT DURATION: 4 weeks  PLANNED INTERVENTIONS: 97168 OT Re-evaluation, 97535 self care/ADL training, 02889 therapeutic exercise, 97530 therapeutic activity, 97140 manual therapy, 97014 electrical stimulation unattended, patient/family education, and DME and/or AE instructions  CONSULTED AND AGREED WITH PLAN OF CARE: Patient  PLAN FOR NEXT SESSION: Follow up on HEP, manual techniques, A/ROM, functional reaching   Sonny Cory, OTR/L  [phone] 12/26/2023, 3:13 PM

## 2023-12-26 NOTE — Therapy (Signed)
 [facility] OUTPATIENT PHYSICAL THERAPY TREATMENT    Patient Name: Karina Sawyer MRN: [id number] DOB:1956-11-06, 68 y.o., female Today's Date: 12/26/2023  END OF SESSION:   PT End of Session - 12/26/23 1430     Visit Number 4    Number of Visits 6    Authorization Type [employer]    PT Start Time 0230    PT Stop Time 0310    PT Time Calculation (min) 40 min    Activity Tolerance Patient tolerated treatment well    Behavior During Therapy [facility] for tasks assessed/performed            Past Medical History:  Diagnosis Date   Abnormal results of liver function studies    Acute maxillary sinusitis    around 2003   Arthritis    knees   Chest pain    Chest pain 2022   Per pt, she experienced a burning sensation in her chest and an abnormal EKG dated 04/26/21. She was sent to Dr. Jeffrie, cardiologist on 04/26/21. Echocardiogram demonstrated LVEF  55 - 60% and severe left atrial dilation. Patient states that she was told everything was fine, and that her chest pain was likely acid reflux.   Heart murmur    many years ago told had a murmur but no one recenctly mentioned   History of kidney stones 2003   Hyperlipidemia    Hypertension    Follows w/ PCP, Dr. Darlyn Hurst takes HCTZ.   Hypothyroidism    Follows with PCP, Dr. Darlyn Hurst. Currently taking Synthroid.   Menopausal and female climacteric states    Morbid obesity (HCC)    PONV (postoperative nausea and vomiting)    Patient states severe nausea and vomiting after anesthesia.   Wears glasses    reading only   Past Surgical History:  Procedure Laterality Date   APPENDECTOMY  1971   COLONOSCOPY  2015   [facility] - no polyps per pt   DILATATION & CURETTAGE/HYSTEROSCOPY WITH MYOSURE N/A 12/05/2022   Procedure: DILATATION & CURETTAGE/HYSTEROSCOPY WITH MYOSURE LITE;  Surgeon: Diedre Rosaline BRAVO, MD;  Location: [facility];  Service: Gynecology;  Laterality: N/A;  Device needed = myosure lite   NECK SURGERY      growth removed at birth   ovary removed     age 25   TUBAL LIGATION  08/24/1989   early 28's   WISDOM TOOTH EXTRACTION     many years ago   There are no active problems to display for this patient.   PCP: Hurst Norleen PEDLAR, MDPCP - General   REFERRING PROVIDER: Beverley Evalene BIRCH, MDRef Provider   REFERRING DIAG: M17.11 (ICD-10-CM) - Osteoarthritis of right knee, unspecified osteoarthritis type   Rationale for Evaluation and Treatment: Rehabilitation  THERAPY DIAG:  Right knee pain, unspecified chronicity  Muscle weakness (generalized)  Difficulty in walking, not elsewhere classified  ONSET DATE: within 3 months --------------------------------------------------------------------------------------------- SUBJECTIVE:  SUBJECTIVE STATEMENT: Patient reports of no knee pain but patient reports deep right knee pain. Returns to Dr Beverley on 01/01/24. Still c/o troubles with bending her right knee fully  IE: Within the last 3 months, patient's right knee pain has increased and has been popping. Cortisone shot to the right knee in October 2024. Patient has been referred to OPPT.   PERTINENT HISTORY:  Morbid obesity  PAIN:  Are you having pain? Yes: NPRS scale: 6-7/10 Pain location: medial right knee  Pain description: dully/achy Aggravating factors: walking Relieving factors: sitting  PRECAUTIONS: None  RED FLAGS: None   WEIGHT BEARING RESTRICTIONS: No  FALLS:  Has patient fallen in last 6 months? No  LIVING ENVIRONMENT: Lives with: lives alone Lives in: House/apartment Stairs: Yes: External: 2 steps; none Has following equipment at home: Single point cane and Walker - 2 wheeled  OCCUPATION: retired  PLOF: Independent  PATIENT GOALS: Patient wants to avoid right knee surgery   NEXT MD  VISIT: TBD  --------------------------------------------------------------------------------------------- OBJECTIVE:   DIAGNOSTIC FINDINGS:  N/a  PATIENT SURVEYS:  LEFS 25/80  SCREENING FOR RED FLAGS: Bowel or bladder incontinence: No Spinal tumors: No Cauda equina syndrome: No Compression fracture: No Abdominal aneurysm: No  COGNITION: Overall cognitive status: Within functional limits for tasks assessed  POSTURE: No Significant postural limitations      FUNCTIONAL TESTS:  5 times sit to stand: 19.23s with upper extremity assist  2 minute walk test: next session   GAIT ANALYSIS: Distance walked: 65ft Assistive device utilized: None Level of assistance: Complete Independence Comments: Patient with moderate right lower extremity antalgia  SENSATION: WFL    LOWER EXTREMITY MMT:    MMT Right eval Left eval  Hip flexion 3/5   Hip extension    Hip abduction    Hip adduction    Hip internal rotation    Hip external rotation    Knee flexion 3-/5 4-/5  Knee extension 3+/5 4-/5  Ankle dorsiflexion    Ankle plantarflexion    Ankle inversion    Ankle eversion     (Blank rows = not tested)  LOWER EXTREMITY ROM:     Active  Right eval Left eval  Hip flexion    Hip extension    Hip abduction    Hip adduction    Hip internal rotation    Hip external rotation    Knee flexion 0-80 0-90  Knee extension    Ankle dorsiflexion    Ankle plantarflexion    Ankle inversion    Ankle eversion     (Blank rows = not tested)  LOWER EXTREMITY SPECIAL TESTS Patellafemoral grind test: positive      PALPATION: Moderate tenderness to palpation right medial knee Moderate hypermobility patella with lateral tracking  INTEGUMENTARY   WFL --------------------------------------------------------------------------------------------- TODAY'S TREATMENT:  DATE:  12/26/23 Knee flexion active range of motion 0-75 at start of session; 0-80 after Supine/ prone Manual therapy to right knee: instrument-assisted soft tissue mobilization/ soft tissue mobilization to knee muscles, joint mobilizations grade 2-3 for knee extension/flexion ROM, PROM of knee, mobilization with movement into knee flexion  Leg curls with 2.5# 2x10 Moist hot pack to right knee 5'   12/19/23 Recumbent bike, seat 13, level 1, rocking x 3' Gr 2 AP/PA tibiofemoral glide x 30 x 3 each Gr 2 patellar mob on all planes x 2' total Heel slides with a strap x 10 x 3 x 30 hold at the beginning of each set  Supine passive R piriformis stretch x 30 x 2 Supine R piriformis stretch with a strap x 30  Seated adductor squeeze x 3 x 10 x 2  12/12/23 Supine  Manual therapy to right knee: soft tissue mobilization to knee muscles, joint mobilizations grade 2-3 for knee extension/flexion ROM, PROM of knee, mobilization with movement into knee flexion  SAQs with 5# then #8 x10 each Seated  Leg curls with 40# x10   Leg press with 40# 2x10  NuStep L3 cool down   12/06/23 Supine  Manual therapy to right knee: soft tissue mobilization to knee muscles, joint mobilizations grade 2-3 for knee extension/flexion ROM, PROM of knee, mobilization with movement into knee flexion Heel slides + strap 10x3H  SAQs with 2# 2x10 LAQs with 2# 2x10       11/28/23 Gait Training with single point cane  PT initial eval     PATIENT EDUCATION:  Education details: HEP Person educated: Patient Education method: Explanation Education comprehension: verbalized understanding  HOME EXERCISE PROGRAM: Access Code: [email or web address] URL: [email or web address] 12/[email or web address]/23 - Supine Heel Slide with Strap (Mirrored)  - 1-2 x daily - 5-7 x weekly - 2 sets - 10 reps (with 30 sec hold at the beginning of each set) - Sidelying Piriformis Stretch  - 1-2 x daily - 5-7 x weekly - 3 reps - 30 hold -  Seated Hip Adduction Isometrics with Ball  - 1-2 x daily - 5-7 x weekly - 2 sets - 10 reps - 3 hold Date: 12/06/2023 Prepared by: Deward Ming  Exercises - Supine Quad Set  - 1-2 x daily - 2 sets - 10 reps - Supine Heel Slide with Strap (Mirrored)  - 1-2 x daily - 2 sets - 10 reps - Sitting Knee Extension with Resistance  - 1-2 x daily - 7 x weekly - 2 sets - 10 reps  Patient Education - Paediatric Nurse with Cane --------------------------------------------------------------------------------------------- ASSESSMENT:  CLINICAL IMPRESSION:  Interventions today were geared towards LE mobility with the use of manual therapy. Patient with 0-75 right knee flexion active range of motion at start of session.  PT used a combination of manual therapy in both supine/ prone; patient with minimal guarding and tightness in right hamstrings/ calf during manual therapy.  At end of session, patient able to achieve 0-80 knee flexion active range of motion. To date, skilled PT is required to address the impairments and improve function.   IE: Patient is a 68 y.o. y.o. female who was seen today for physical therapy evaluation and treatment for right knee pain, weakness, difficulty walking. Patient presents to PT with the following objective impairments: Abnormal gait, decreased activity tolerance, decreased mobility, difficulty walking, decreased ROM, decreased strength, improper body mechanics, obesity, and pain. These impairments limit the patient in activities such as carrying, lifting, bending, standing, squatting, stairs, transfers,  and locomotion level. These impairments also limit the patient in participation such as meal prep, cleaning, laundry, interpersonal relationship, driving, shopping, community activity, and yard work. The patient will benefit from PT to address the limitations/impairments listed below to return to their prior level of function in the domains of activity and participation.     PERSONAL FACTORS:  obesity  are also affecting patient's functional outcome.   REHAB POTENTIAL: Good  CLINICAL DECISION MAKING: Stable/uncomplicated  EVALUATION COMPLEXITY: Low  --------------------------------------------------------------------------------------------- GOALS: Goals reviewed with patient? No  SHORT TERM GOALS: Target date: 12/19/2023   1. Patient will be independent with a basic stretching/strengthening HEP  Baseline:  Goal status: INITIAL 2. Patient will be able to demonstrate right knee flexion active range of motion to 0-85 degrees to facilitate ADL completion, lifting, squatting. Baseline:  Goal status: INITIAL   LONG TERM GOALS: Target date: 01/09/2024    Patient will score a >/= 35/80 on the LEFS    to demonstrate a Minimally Clinically Importance Difference (MCID) in ADL completion, home/community ambulation, and lifting/bending/squatting. Baseline:  Goal status: INITIAL  2.  Patient will complete the 5 times sit to stand:    within 16s to demonstrate an improvement in ADL completion, stair negotiation, household/community ambulation, and self-care Baseline:  Goal status: INITIAL  3.  Patient will be independent with a comprehensive strengthening HEP  Baseline:  Goal status: INITIAL  4. Patient will be able to demonstrate right knee flexion active range of motion to 0-100 degrees to facilitate ADL completion, lifting, squatting. Baseline:  Goal status: INITIAL  --------------------------------------------------------------------------------------------- PLAN:  PT FREQUENCY: 1x/week  PT DURATION: 6 weeks  PLANNED INTERVENTIONS: 97110-Therapeutic exercises, 97530- Therapeutic activity, 97112- Neuromuscular re-education, 97535- Self Care, 02859- Manual therapy, [phone]- Gait training, Patient/Family education, Balance training, Stair training, Taping, Dry Needling, Joint mobilization, Joint manipulation, Spinal manipulation, Spinal  mobilization, DME instructions, Cryotherapy, and Moist heat.  PLAN FOR NEXT SESSION: Progress lower extremity strengthening as tolerated    Deward Ming PT, DPT  12/26/2023, 2:31 PM

## 2023-12-26 NOTE — Patient Instructions (Signed)

## 2024-01-01 DIAGNOSIS — M1711 Unilateral primary osteoarthritis, right knee: Secondary | ICD-10-CM | POA: Diagnosis not present

## 2024-01-01 DIAGNOSIS — M19011 Primary osteoarthritis, right shoulder: Secondary | ICD-10-CM | POA: Diagnosis not present

## 2024-01-02 ENCOUNTER — Encounter (HOSPITAL_COMMUNITY): Payer: Self-pay | Admitting: Occupational Therapy

## 2024-01-02 ENCOUNTER — Ambulatory Visit (HOSPITAL_COMMUNITY): Payer: HMO | Admitting: Occupational Therapy

## 2024-01-02 ENCOUNTER — Ambulatory Visit (HOSPITAL_COMMUNITY): Payer: HMO

## 2024-01-02 DIAGNOSIS — M25611 Stiffness of right shoulder, not elsewhere classified: Secondary | ICD-10-CM

## 2024-01-02 DIAGNOSIS — G8929 Other chronic pain: Secondary | ICD-10-CM

## 2024-01-02 DIAGNOSIS — M25561 Pain in right knee: Secondary | ICD-10-CM | POA: Diagnosis not present

## 2024-01-02 DIAGNOSIS — R262 Difficulty in walking, not elsewhere classified: Secondary | ICD-10-CM

## 2024-01-02 DIAGNOSIS — R29898 Other symptoms and signs involving the musculoskeletal system: Secondary | ICD-10-CM

## 2024-01-02 DIAGNOSIS — M6281 Muscle weakness (generalized): Secondary | ICD-10-CM

## 2024-01-02 NOTE — Therapy (Signed)
 [facility] PH[facility] THERAPY TREATMENT    Patient Name: Karina Sawyer MRN: [id number] DOB:[birth year], 68 y.o., female Today's Date: 01/02/2024  END OF SESSION:   PT End of Session - 01/02/24 1021     Visit Number 5    Number of Visits 6    Authorization Type [employer]    PT Start Time 1020    PT Stop Time 1100    PT Time Calculation (min) 40 min    Activity Tolerance Patient tolerated treatment well    Behavior During Therapy [facility] for tasks assessed/performed            Past Medical History:  Diagnosis Date   Abnormal results of liver function studies    Acute maxillary sinusitis    around 2003   Arthritis    knees   Chest pain    Chest pain 2022   Per pt, she experienced a burning sensation in her chest and an abnormal EKG dated 04/26/21. She was sent to Dr. Jeffrie, cardiologist on 04/26/21. Echocardiogram demonstrated LVEF  55 - 60% and severe left atrial dilation. Patient states that she was told everything was fine, and that her chest pain was likely acid reflux.   Heart murmur    many years ago told had a murmur but no one recenctly mentioned   History of kidney stones 2003   Hyperlipidemia    Hypertension    Follows w/ PCP, Dr. Darlyn Hurst takes HCTZ.   Hypothyroidism    Follows with PCP, Dr. Darlyn Hurst. Currently taking Synthroid.   Menopausal and female climacteric states    Morbid obesity (HCC)    PONV (postoperative nausea and vomiting)    Patient states severe nausea and vomiting after anesthesia.   Wears glasses    reading only   Past Surgical History:  Procedure Laterality Date   APPENDECTOMY  1971   COLONOSCOPY  2015   [facility] - no polyps per pt   DILATATION & CURETTAGE/HYSTEROSCOPY WITH MYOSURE N/A 12/05/2022   Procedure: DILATATION & CURETTAGE/HYSTEROSCOPY WITH MYOSURE LITE;  Surgeon: Diedre Rosaline BRAVO, MD;  Location: [place];  Service: Gynecology;  Laterality: N/A;  Device needed = myosure lite   NECK SURGERY      growth removed at birth   ovary removed     age 42   TUBAL LIGATION  08/24/1989   early 86's   WISDOM TOOTH EXTRACTION     many years ago   There are no active problems to display for this patient.   PCP: Hurst Norleen PEDLAR, MDPCP - General   REFERRING PROVIDER: Beverley Evalene BIRCH, MDRef Provider   REFERRING DIAG: M17.11 (ICD-10-CM) - Osteoarthritis of right knee, unspecified osteoarthritis type   Rationale for Evaluation and Treatment: Rehabilitation  THERAPY DIAG:  Right knee pain, unspecified chronicity  Muscle weakness (generalized)  Difficulty in walking, not elsewhere classified  ONSET DATE: within 3 months --------------------------------------------------------------------------------------------- SUBJECTIVE:  SUBJECTIVE STATEMENT: Today: Patient states she saw MD; MD states patient is doing well; no surgery indicated at this time.   IE: Within the last 3 months, patient's right knee pain has increased and has been popping. Cortisone shot to the right knee in October 2024. Patient has been referred to OPPT. (Daughter, Olam present).    PERTINENT HISTORY:  Morbid obesity  PAIN:  Are you having pain? Yes: NPRS scale: 6-7/10 Pain location: medial right knee  Pain description: dully/achy Aggravating factors: walking Relieving factors: sitting  PRECAUTIONS: None  RED FLAGS: None   WEIGHT BEARING RESTRICTIONS: No  FALLS:  Has patient fallen in last 6 months? No  LIVING ENVIRONMENT: Lives with: lives alone Lives in: House/apartment Stairs: Yes: External: 2 steps; none Has following equipment at home: Single point cane and Walker - 2 wheeled  OCCUPATION: retired  PLOF: Independent  PATIENT GOALS: Patient wants to avoid right knee surgery   NEXT MD VISIT: TBD   --------------------------------------------------------------------------------------------- OBJECTIVE:   DIAGNOSTIC FINDINGS:  N/a  PATIENT SURVEYS:  LEFS 25/80  SCREENING FOR RED FLAGS: Bowel or bladder incontinence: No Spinal tumors: No Cauda equina syndrome: No Compression fracture: No Abdominal aneurysm: No  COGNITION: Overall cognitive status: Within functional limits for tasks assessed  POSTURE: No Significant postural limitations      FUNCTIONAL TESTS:  5 times sit to stand: 19.23s with upper extremity assist  2 minute walk test: next session   GAIT ANALYSIS: Distance walked: 23ft Assistive device utilized: None Level of assistance: Complete Independence Comments: Patient with moderate right lower extremity antalgia  SENSATION: WFL    LOWER EXTREMITY MMT:    MMT Right eval Left eval  Hip flexion 3/5   Hip extension    Hip abduction    Hip adduction    Hip internal rotation    Hip external rotation    Knee flexion 3-/5 4-/5  Knee extension 3+/5 4-/5  Ankle dorsiflexion    Ankle plantarflexion    Ankle inversion    Ankle eversion     (Blank rows = not tested)  LOWER EXTREMITY ROM:     Active  Right eval Left eval  Hip flexion    Hip extension    Hip abduction    Hip adduction    Hip internal rotation    Hip external rotation    Knee flexion 0-80 0-90  Knee extension    Ankle dorsiflexion    Ankle plantarflexion    Ankle inversion    Ankle eversion     (Blank rows = not tested)  LOWER EXTREMITY SPECIAL TESTS Patellafemoral grind test: positive      PALPATION: Moderate tenderness to palpation right medial knee Moderate hypermobility patella with lateral tracking  INTEGUMENTARY   WFL --------------------------------------------------------------------------------------------- TODAY'S TREATMENT:  DATE:  01/02/24 Seated NuStep Level 2, seat 10,  warm-up Leg press with 50#, then 70#, then 90# x10 each   Minimal assist to get into machine  Leg extension machine with 20# x10  Supine  LAQS with 5# 2x10      12/26/23 Right Knee flexion active range of motion 0-75 at start of session; 0-80 after Supine/ prone Manual therapy to right knee: instrument-assisted soft tissue mobilization/ soft tissue mobilization to knee muscles, joint mobilizations grade 2-3 for knee extension/flexion ROM, PROM of knee, mobilization with movement into knee flexion  Leg curls with 2.5# 2x10 Moist hot pack to right knee 5'   12/19/23 Recumbent bike, seat 13, level 1, rocking x 3' Gr 2 AP/PA tibiofemoral glide x 30 x 3 each Gr 2 patellar mob on all planes x 2' total Heel slides with a strap x 10 x 3 x 30 hold at the beginning of each set  Supine passive R piriformis stretch x 30 x 2 Supine R piriformis stretch with a strap x 30  Seated adductor squeeze x 3 x 10 x 2  12/12/23 Supine  Manual therapy to right knee: soft tissue mobilization to knee muscles, joint mobilizations grade 2-3 for knee extension/flexion ROM, PROM of knee, mobilization with movement into knee flexion  SAQs with 5# then #8 x10 each Seated  Leg curls with 40# x10   Leg press with 40# 2x10  NuStep L3 cool down   12/06/23 Supine  Manual therapy to right knee: soft tissue mobilization to knee muscles, joint mobilizations grade 2-3 for knee extension/flexion ROM, PROM of knee, mobilization with movement into knee flexion Heel slides + strap 10x3H  SAQs with 2# 2x10 LAQs with 2# 2x10       11/28/23 Gait Training with single point cane  PT initial eval     PATIENT EDUCATION:  Education details: HEP Person educated: Patient Education method: Explanation Education comprehension: verbalized understanding  HOME EXERCISE PROGRAM: Access Code: [email or web address] URL: [email or web address] 12/[email or web address]/23 -  Supine Heel Slide with Strap (Mirrored)  - 1-2 x daily - 5-7 x weekly - 2 sets - 10 reps (with 30 sec hold at the beginning of each set) - Sidelying Piriformis Stretch  - 1-2 x daily - 5-7 x weekly - 3 reps - 30 hold - Seated Hip Adduction Isometrics with Ball  - 1-2 x daily - 5-7 x weekly - 2 sets - 10 reps - 3 hold Date: 12/06/2023 Prepared by: Deward Ming  Exercises - Supine Quad Set  - 1-2 x daily - 2 sets - 10 reps - Supine Heel Slide with Strap (Mirrored)  - 1-2 x daily - 2 sets - 10 reps - Sitting Knee Extension with Resistance  - 1-2 x daily - 7 x weekly - 2 sets - 10 reps  Patient Education - Paediatric Nurse with [facility] ----------------------------------------------------------------------------------------------  ASSESSMENT:  CLINICAL IMPRESSION:  Today: PT progress patient with lower extremity strengthening today. PT introduced the use of resistance machines. Patient was able to tolerate added resistance well. Requires minimal verbal cueing for proper therapeutic exercise progression.  To date, skilled PT is required to address the impairments and improve function.   IE: Patient is a 68 y.o. y.o. female who was seen today for physical therapy evaluation and treatment for right knee pain, weakness, difficulty walking. Patient presents to PT with the following objective impairments: Abnormal gait, decreased activity tolerance, decreased mobility, difficulty walking, decreased ROM, decreased strength, improper body mechanics, obesity, and pain. These  impairments limit the patient in activities such as carrying, lifting, bending, standing, squatting, stairs, transfers, and locomotion level. These impairments also limit the patient in participation such as meal prep, cleaning, laundry, interpersonal relationship, driving, shopping, community activity, and yard work. The patient will benefit from PT to address the limitations/impairments listed below to return to their prior level of  function in the domains of activity and participation.    PERSONAL FACTORS:  obesity  are also affecting patient's functional outcome.   REHAB POTENTIAL: Good  CLINICAL DECISION MAKING: Stable/uncomplicated  EVALUATION COMPLEXITY: Low  --------------------------------------------------------------------------------------------- GOALS: Goals reviewed with patient? No  SHORT TERM GOALS: Target date: 12/19/2023   1. Patient will be independent with a basic stretching/strengthening HEP  Baseline:  Goal status: INITIAL 2. Patient will be able to demonstrate right knee flexion active range of motion to 0-85 degrees to facilitate ADL completion, lifting, squatting. Baseline:  Goal status: INITIAL   LONG TERM GOALS: Target date: 01/09/2024    Patient will score a >/= 35/80 on the LEFS    to demonstrate a Minimally Clinically Importance Difference (MCID) in ADL completion, home/community ambulation, and lifting/bending/squatting. Baseline:  Goal status: INITIAL  2.  Patient will complete the 5 times sit to stand:    within 16s to demonstrate an improvement in ADL completion, stair negotiation, household/community ambulation, and self-care Baseline:  Goal status: INITIAL  3.  Patient will be independent with a comprehensive strengthening HEP  Baseline:  Goal status: INITIAL  4. Patient will be able to demonstrate right knee flexion active range of motion to 0-100 degrees to facilitate ADL completion, lifting, squatting. Baseline:  Goal status: INITIAL  --------------------------------------------------------------------------------------------- PLAN:  PT FREQUENCY: 1x/week  PT DURATION: 6 weeks  PLANNED INTERVENTIONS: 97110-Therapeutic exercises, 97530- Therapeutic activity, 97112- Neuromuscular re-education, 97535- Self Care, 02859- Manual therapy, [phone]- Gait training, Patient/Family education, Balance training, Stair training, Taping, Dry Needling, Joint mobilization,  Joint manipulation, Spinal manipulation, Spinal mobilization, DME instructions, Cryotherapy, and Moist heat.  PLAN FOR NEXT SESSION: Progress lower extremity strengthening as tolerated    Deward Ming PT, DPT  01/02/2024, 10:22 AM

## 2024-01-02 NOTE — Therapy (Signed)
 OUTPATIENT OCCUPATIONAL THERAPY ORTHO TREATMENT   Patient Name: Karina Sawyer MRN: [id number] DOB:05-22-1956, 68 y.o., female Today's Date: 01/02/2024   END OF SESSION:  OT End of Session - 01/02/24 1015     Visit Number 6    Number of Visits 9    Date for OT Re-Evaluation 01/25/24    Authorization Type [employer]    Progress Note Due on Visit 10    OT Start Time 0933    OT Stop Time 1015    OT Time Calculation (min) 42 min    Activity Tolerance Patient tolerated treatment well    Behavior During Therapy [facility] for tasks assessed/performed             Past Medical History:  Diagnosis Date   Abnormal results of liver function studies    Acute maxillary sinusitis    around 2003   Arthritis    knees   Chest pain    Chest pain 2022   Per pt, she experienced a burning sensation in her chest and an abnormal EKG dated 04/26/21. She was sent to Dr. Jeffrie, cardiologist on 04/26/21. Echocardiogram demonstrated LVEF  55 - 60% and severe left atrial dilation. Patient states that she was told everything was fine, and that her chest pain was likely acid reflux.   Heart murmur    many years ago told had a murmur but no one recenctly mentioned   History of kidney stones 2003   Hyperlipidemia    Hypertension    Follows w/ PCP, Dr. Darlyn Hurst takes HCTZ.   Hypothyroidism    Follows with PCP, Dr. Darlyn Hurst. Currently taking Synthroid.   Menopausal and female climacteric states    Morbid obesity (HCC)    PONV (postoperative nausea and vomiting)    Patient states severe nausea and vomiting after anesthesia.   Wears glasses    reading only   Past Surgical History:  Procedure Laterality Date   APPENDECTOMY  1971   COLONOSCOPY  2015   [facility] - no polyps per pt   DILATATION & CURETTAGE/HYSTEROSCOPY WITH MYOSURE N/A 12/05/2022   Procedure: DILATATION & CURETTAGE/HYSTEROSCOPY WITH MYOSURE LITE;  Surgeon: Diedre Rosaline BRAVO, MD;  Location: [facility] [place];   Service: Gynecology;  Laterality: N/A;  Device needed = myosure lite   NECK SURGERY     growth removed at birth   ovary removed     age 62   TUBAL LIGATION  08/24/1989   early 81's   WISDOM TOOTH EXTRACTION     many years ago   There are no active problems to display for this patient.   PCP: Dr. Norleen Hurst REFERRING PROVIDER: Dr. Evalene Chancy  ONSET DATE: several months  REFERRING DIAG: M19.011 (ICD-10-CM) - Osteoarthritis of right shoulder, unspecified osteoarthritis type   THERAPY DIAG:  Chronic right shoulder pain  Stiffness of right shoulder, not elsewhere classified  Other symptoms and signs involving the musculoskeletal system  Rationale for Evaluation and Treatment: Rehabilitation  SUBJECTIVE:   SUBJECTIVE STATEMENT: S: The doctor said I don't need to see him anymore.   PERTINENT HISTORY: Pt is a 68 y/o female presenting with chronic right shoulder pain. Pt has received several injections, 2 in the last month one of which was US  guided, with some relief. Pt would like to try conservative treatment before opting for TSA.   PRECAUTIONS: None  WEIGHT BEARING RESTRICTIONS: No  PAIN:  Are you having pain? No  FALLS: Has patient fallen in  last 6 months? No  PLOF: Independent  PATIENT GOALS: To be able to move my arm more.   NEXT MD VISIT: 01/01/2024  OBJECTIVE:   HAND DOMINANCE: Right  ADLs: Overall ADLs: I can't hardly brush my teeth or brush my hair. My hand shakes when holding a cup of water. Pt has difficulty dressing and undressing, has to have help unhooking bra. Pt is unable to reach overhead or behind back.   FUNCTIONAL OUTCOME MEASURES: FOTO: 45/100 12/26/23: 56/100  UPPER EXTREMITY ROM:       Assessed in sitting, er/IR adducted  Active ROM Right eval Right 12/26/23  Shoulder flexion 94 115  Shoulder abduction 103 123  Shoulder internal rotation 90 90  Shoulder external rotation 18 25  (Blank rows = not tested)  Assessed in supine,  er/IR adducted  Passive ROM Right eval Right 12/26/23  Shoulder flexion 110 132  Shoulder abduction 108 127  Shoulder internal rotation 90 90  Shoulder external rotation 20 20  (Blank rows = not tested)    UPPER EXTREMITY MMT:     Assessed in sitting, er/IR adducted  MMT Right eval Right 12/26/23  Shoulder flexion 3-/5 4/5  Shoulder abduction 3-/5 3+/5  Shoulder internal rotation 3/5 5/5  Shoulder external rotation 3-/5 4-/5  (Blank rows = not tested)  HAND FUNCTION: Grip strength: Right: 48 lbs; Left: 45 lbs  OBSERVATIONS: mod fascial restrictions along right upper arm, trapezius, and scapular regions; degenerative creptius noted with mobility   TODAY'S TREATMENT:                                                                                                                              DATE:   01/02/24 -Myofascial release to right upper arm, anterior shoulder, trapezius, and scapular regions to decrease pain and fascial restrictions and increase joint ROM -AA/ROM: supine-protraction, flexion, abduction, horizontal abduction, er, x12 -A/ROM: seated-protraction, flexion, horizontal abduction, er/IR, abduction, x10 -Isometrics: flexion, extension, abduction, IR, 4x10  12/26/23 -Myofascial release to right upper arm, anterior shoulder, trapezius, and scapular regions to decrease pain and fascial restrictions and increase joint ROM -P/ROM: supine-flexion, abduction, er, 5 reps  -A/ROM: seated-protraction, flexion, horizontal abduction, er/IR, abduction, 10 reps -Wall wash: 1' flexion -Functional reaching: pt placing 10 cones on middle shelf of overhead cabinet in flexion then removing, repeating in abduction  12/19/23 -Myofascial release to right upper arm, anterior shoulder, trapezius, and scapular regions to decrease pain and fascial restrictions and increase joint ROM -P/ROM: supine-flexion, abduction, er, 5 reps  -AA/ROM: supine-protraction, flexion, horizontal abduction,  er, 10 reps -Isometrics: standing-flexion, extension, abduction, er, IR, 3x15 holds -Wall wash: 1' flexion -Scapular theraband: red-row, extension, 10 reps -Functional reaching: pt placing 5 cones on middle shelf of overhead cabinet in flexion, removing in abduction - UBE bike: level 2, 3' forwards 2' reverse 6.0 pace    PATIENT EDUCATION: Education details: Isometrics Person educated: Patient and Child(ren) Education method: Explanation, Demonstration, and Handouts Education  comprehension: verbalized understanding and returned demonstration  HOME EXERCISE PROGRAM: Elyn: AA/ROM 12/26/23: A/ROM 01/02/24: Isometrics  GOALS: Goals reviewed with patient? Yes   SHORT TERM GOALS: Target date: 12/28/23  Pt will be provided with and educated on HEP to improve mobility in RUE required for use during ADL completion.   Goal status: IN PROGRESS  2.  Pt will increase RUE A/ROM by 25 degrees to improve ability to use RUE during dressing tasks with minimal compensatory techniques.   Goal status: REVISED  3.  Pt will increase RUE strength to 4/5 to improve ability to reach for items at waist to chest height during dressing, bathing, and grooming tasks.   Goal status: REVISED  4.  Pt will decrease pain in RUE to 3/10 or less to improve ability to perform grooming and bathing tasks with minimal discomfort.   Goal status: MET  5.  Pt will decrease RUE fascial restrictions to min amounts or less to improve mobility required for functional reaching tasks.   Goal status: IN PROGRESS   ASSESSMENT:  CLINICAL IMPRESSION: This session, pt continues to demonstrate some mild to moderate mobility limitations. She has lots of audible popping and grinding with all mobility, however reports no increase in  pain during these exercises. Pt demonstrating increased fatigue with increased repetitions this session. OT added isometrics to pt's HEP in order to continue progressing her strength at home. Verbal and  tactile cuing provided for postioning and technique throughout session.    PERFORMANCE DEFICITS: in functional skills including in functional skills including ADLs, IADLs, coordination, tone, ROM, strength, pain, fascial restrictions, muscle spasms, and UE functional use   PLAN:  OT FREQUENCY: 1x/week  OT DURATION: 4 weeks  PLANNED INTERVENTIONS: 97168 OT Re-evaluation, 97535 self care/ADL training, 02889 therapeutic exercise, 97530 therapeutic activity, 97140 manual therapy, 97014 electrical stimulation unattended, patient/family education, and DME and/or AE instructions  CONSULTED AND AGREED WITH PLAN OF CARE: Patient  PLAN FOR NEXT SESSION: Follow up on HEP, manual techniques, A/ROM, functional reaching    Valentin Nightingale, OTR/L [phone] 01/02/2024, 11:52 AM

## 2024-01-02 NOTE — Patient Instructions (Signed)
  Complete the following 2-3 a day. Hold for 10-15 seconds. Complete 4-6 sets for each.   1) SHOULDER - ISOMETRIC FLEXION  Gently push your fist forward into a wall with your elbow bent.    2) SHOULDER - ISOMETRIC EXTENSION  Gently push your a bent elbow back into a wall.    3) SHOULDER - ISOMETRIC INTERNAL ROTATION   Gently press your hand into a wall using the palm side of your hand.  Maintain a bent elbow the entire time.        4) SHOULDER - ISOMETRIC ADDUCTION  Gently push your elbow into the side of your body.   5) SHOULDER - ISOMETRIC ABDUCTION  Gently push your elbow out to the side into a wall with your elbow bent.

## 2024-01-09 ENCOUNTER — Ambulatory Visit (HOSPITAL_COMMUNITY): Payer: HMO | Admitting: Physical Therapy

## 2024-01-09 ENCOUNTER — Ambulatory Visit (HOSPITAL_COMMUNITY): Payer: HMO | Admitting: Occupational Therapy

## 2024-01-09 ENCOUNTER — Encounter (HOSPITAL_COMMUNITY): Payer: Self-pay | Admitting: Occupational Therapy

## 2024-01-09 DIAGNOSIS — M6281 Muscle weakness (generalized): Secondary | ICD-10-CM

## 2024-01-09 DIAGNOSIS — R262 Difficulty in walking, not elsewhere classified: Secondary | ICD-10-CM

## 2024-01-09 DIAGNOSIS — M25561 Pain in right knee: Secondary | ICD-10-CM | POA: Diagnosis not present

## 2024-01-09 DIAGNOSIS — M25611 Stiffness of right shoulder, not elsewhere classified: Secondary | ICD-10-CM

## 2024-01-09 DIAGNOSIS — R29898 Other symptoms and signs involving the musculoskeletal system: Secondary | ICD-10-CM

## 2024-01-09 DIAGNOSIS — G8929 Other chronic pain: Secondary | ICD-10-CM

## 2024-01-09 NOTE — Therapy (Signed)
OUTPATIENT OCCUPATIONAL THERAPY ORTHO TREATMENT   Patient Name: Karina Sawyer MRN: [id number] DOB:1956-11-23, 68 y.o., female Today's Date: 01/09/2024   END OF SESSION:  OT End of Session - 01/09/24 1056     Visit Number 7    Number of Visits 9    Date for OT Re-Evaluation 01/25/24    Authorization Type [employer]    Progress Note Due on Visit 10    OT Start Time 1020    OT Stop Time 1100    OT Time Calculation (min) 40 min    Activity Tolerance Patient tolerated treatment well    Behavior During Therapy [facility] for tasks assessed/performed              Past Medical History:  Diagnosis Date   Abnormal results of liver function studies    Acute maxillary sinusitis    around 2003   Arthritis    knees   Chest pain    Chest pain 2022   Per pt, she experienced a burning sensation in her chest and an abnormal EKG dated 04/26/21. She was sent to Dr. Anne Fu, cardiologist on 04/26/21. Echocardiogram demonstrated LVEF  55 - 60% and severe left atrial dilation. Patient states that she was told everything was fine, and that her chest pain was likely acid reflux.   Heart murmur    many years ago told had a murmur but no one recenctly mentioned   History of kidney stones 2003   Hyperlipidemia    Hypertension    Follows w/ PCP, Dr. Catalina Pizza takes HCTZ.   Hypothyroidism    Follows with PCP, Dr. Catalina Pizza. Currently taking Synthroid.   Menopausal and female climacteric states    Morbid obesity (HCC)    PONV (postoperative nausea and vomiting)    Patient states severe nausea and vomiting after anesthesia.   Wears glasses    reading only   Past Surgical History:  Procedure Laterality Date   APPENDECTOMY  1971   COLONOSCOPY  2015   [facility] - no polyps per pt   DILATATION & CURETTAGE/HYSTEROSCOPY WITH MYOSURE N/A 12/05/2022   Procedure: DILATATION & CURETTAGE/HYSTEROSCOPY WITH MYOSURE LITE;  Surgeon: Charlett Nose, MD;  Location: [facility];   Service: Gynecology;  Laterality: N/A;  Device needed = myosure lite   NECK SURGERY     growth removed at birth   ovary removed     age 51   TUBAL LIGATION  08/24/1989   early 29's   WISDOM TOOTH EXTRACTION     many years ago   There are no active problems to display for this patient.   PCP: Dr. Nita Sells REFERRING PROVIDER: Dr. Margarita Rana  ONSET DATE: several months  REFERRING DIAG: M19.011 (ICD-10-CM) - Osteoarthritis of right shoulder, unspecified osteoarthritis type   THERAPY DIAG:  Chronic right shoulder pain  Stiffness of right shoulder, not elsewhere classified  Other symptoms and signs involving the musculoskeletal system  Rationale for Evaluation and Treatment: Rehabilitation  SUBJECTIVE:   SUBJECTIVE STATEMENT: S: "I bought a new pillow and haven't had any pain."   PERTINENT HISTORY: Pt is a 68 y/o female presenting with chronic right shoulder pain. Pt has received several injections, 2 in the last month one of which was US guided, with some relief. Pt would like to try conservative treatment before opting for TSA.   PRECAUTIONS: None  WEIGHT BEARING RESTRICTIONS: No  PAIN:  Are you having pain? No  FALLS: Has patient fallen  in last 6 months? No  PLOF: Independent  PATIENT GOALS: To be able to move my arm more.   NEXT MD VISIT: None  OBJECTIVE:   HAND DOMINANCE: Right  ADLs: Overall ADLs: I can't hardly brush my teeth or brush my hair. My hand shakes when holding a cup of water. Pt has difficulty dressing and undressing, has to have help unhooking bra. Pt is unable to reach overhead or behind back.   FUNCTIONAL OUTCOME MEASURES: FOTO: 45/100 12/26/23: 56/100  UPPER EXTREMITY ROM:       Assessed in sitting, er/IR adducted  Active ROM Right eval Right 12/26/23  Shoulder flexion 94 115  Shoulder abduction 103 123  Shoulder internal rotation 90 90  Shoulder external rotation 18 25  (Blank rows = not tested)  Assessed in supine, er/IR  adducted  Passive ROM Right eval Right 12/26/23  Shoulder flexion 110 132  Shoulder abduction 108 127  Shoulder internal rotation 90 90  Shoulder external rotation 20 20  (Blank rows = not tested)    UPPER EXTREMITY MMT:     Assessed in sitting, er/IR adducted  MMT Right eval Right 12/26/23  Shoulder flexion 3-/5 4/5  Shoulder abduction 3-/5 3+/5  Shoulder internal rotation 3/5 5/5  Shoulder external rotation 3-/5 4-/5  (Blank rows = not tested)  HAND FUNCTION: Grip strength: Right: 48 lbs; Left: 45 lbs  OBSERVATIONS: mod fascial restrictions along right upper arm, trapezius, and scapular regions; degenerative creptius noted with mobility   TODAY'S TREATMENT:                                                                                                                              DATE:   01/09/24 -Myofascial release to right upper arm, anterior shoulder, trapezius, and scapular regions to decrease pain and fascial restrictions and increase joint ROM -P/ROM: supine-flexion, abduction, er, 5 reps  -A/ROM: supine-protraction, flexion, horizontal abduction, er/IR, abduction, 10 reps -Proximal shoulder strengthening: supine-paddles, criss cross, circles each direction -Functional reaching: pt placing 8 cones on middle shelf of overhead cabinet in flexion, removing in abduction -Overhead lacing: seated-lacing from top down then reversing -Scapular theraband: row, extension, retraction, 10 reps -UBE: level 2, 3' forward 3' reverse, pace: 6.5  01/02/24 -Myofascial release to right upper arm, anterior shoulder, trapezius, and scapular regions to decrease pain and fascial restrictions and increase joint ROM -AA/ROM: supine-protraction, flexion, abduction, horizontal abduction, er, x12 -A/ROM: seated-protraction, flexion, horizontal abduction, er/IR, abduction, x10 -Isometrics: flexion, extension, abduction, IR, 4x10"  12/26/23 -Myofascial release to right upper arm, anterior  shoulder, trapezius, and scapular regions to decrease pain and fascial restrictions and increase joint ROM -P/ROM: supine-flexion, abduction, er, 5 reps  -A/ROM: seated-protraction, flexion, horizontal abduction, er/IR, abduction, 10 reps -Wall wash: 1' flexion -Functional reaching: pt placing 10 cones on middle shelf of overhead cabinet in flexion then removing, repeating in abduction     PATIENT EDUCATION: Education details: reviewed HEP Person educated: Patient and  Child(ren) Education method: Explanation, Demonstration, and Handouts Education comprehension: verbalized understanding and returned demonstration  HOME EXERCISE PROGRAM: Carley Hammed: AA/ROM 12/26/23: A/ROM 01/02/24: Isometrics  GOALS: Goals reviewed with patient? Yes   SHORT TERM GOALS: Target date: 12/28/23  Pt will be provided with and educated on HEP to improve mobility in RUE required for use during ADL completion.   Goal status: IN PROGRESS  2.  Pt will increase RUE A/ROM by 25 degrees to improve ability to use RUE during dressing tasks with minimal compensatory techniques.   Goal status: REVISED  3.  Pt will increase RUE strength to 4/5 to improve ability to reach for items at waist to chest height during dressing, bathing, and grooming tasks.   Goal status: REVISED  4.  Pt will decrease pain in RUE to 3/10 or less to improve ability to perform grooming and bathing tasks with minimal discomfort.   Goal status: MET  5.  Pt will decrease RUE fascial restrictions to min amounts or less to improve mobility required for functional reaching tasks.   Goal status: IN PROGRESS   ASSESSMENT:  CLINICAL IMPRESSION: Pt reports she changed her pillow and has had little to no pain when sleeping now. Continued with manual techniques and A/ROM, and functional reaching. Pt doing well, ROM to approximately 60% with minimal pain. Added overhead lacing and scapular theraband. Verbal cuing for form and technique. Minimal popping  noted today, pt notes less popping than previous sessions.   PERFORMANCE DEFICITS: in functional skills including in functional skills including ADLs, IADLs, coordination, tone, ROM, strength, pain, fascial restrictions, muscle spasms, and UE functional use   PLAN:  OT FREQUENCY: 1x/week  OT DURATION: 4 weeks  PLANNED INTERVENTIONS: 97168 OT Re-evaluation, 97535 self care/ADL training, 46962 therapeutic exercise, 97530 therapeutic activity, 97140 manual therapy, 97014 electrical stimulation unattended, patient/family education, and DME and/or AE instructions  CONSULTED AND AGREED WITH PLAN OF CARE: Patient  PLAN FOR NEXT SESSION: Follow up on HEP, manual techniques, A/ROM, functional reaching, update HEP for scapular theraband    Ezra Sites, OTR/L  [phone] 01/09/2024, 11:02 AM

## 2024-01-09 NOTE — Therapy (Signed)
[facility] OUTPATIENT PHYSICAL THERAPY TREATMENT    Patient Name: Karina Sawyer MRN: [id number] DOB:21-Aug-1956, 68 y.o., female Today's Date: 01/09/2024  END OF SESSION:   PT End of Session - 01/09/24 1534     Visit Number 6    Number of Visits 6    Authorization Type [employer]    Progress Note Due on Visit 0    PT Start Time 1104    PT Stop Time 1148    PT Time Calculation (min) 44 min    Activity Tolerance Patient tolerated treatment well    Behavior During Therapy [facility] for tasks assessed/performed             Past Medical History:  Diagnosis Date   Abnormal results of liver function studies    Acute maxillary sinusitis    around 2003   Arthritis    knees   Chest pain    Chest pain 2022   Per pt, she experienced a burning sensation in her chest and an abnormal EKG dated 04/26/21. She was sent to Dr. Anne Fu, cardiologist on 04/26/21. Echocardiogram demonstrated LVEF  55 - 60% and severe left atrial dilation. Patient states that she was told everything was fine, and that her chest pain was likely acid reflux.   Heart murmur    many years ago told had a murmur but no one recenctly mentioned   History of kidney stones 2003   Hyperlipidemia    Hypertension    Follows w/ PCP, Dr. Catalina Pizza takes HCTZ.   Hypothyroidism    Follows with PCP, Dr. Catalina Pizza. Currently taking Synthroid.   Menopausal and female climacteric states    Morbid obesity (HCC)    PONV (postoperative nausea and vomiting)    Patient states severe nausea and vomiting after anesthesia.   Wears glasses    reading only   Past Surgical History:  Procedure Laterality Date   APPENDECTOMY  1971   COLONOSCOPY  2015   [facility] - no polyps per pt   DILATATION & CURETTAGE/HYSTEROSCOPY WITH MYOSURE N/A 12/05/2022   Procedure: DILATATION & CURETTAGE/HYSTEROSCOPY WITH MYOSURE LITE;  Surgeon: Charlett Nose, MD;  Location: [facility];  Service: Gynecology;  Laterality: N/A;  Device needed  = myosure lite   NECK SURGERY     growth removed at birth   ovary removed     age 55   TUBAL LIGATION  08/24/1989   early 20's   WISDOM TOOTH EXTRACTION     many years ago   There are no active problems to display for this patient.   PCP: Benita Stabile, MDPCP - General   REFERRING PROVIDER: Sheral Apley, MDRef Provider   REFERRING DIAG: M17.11 (ICD-10-CM) - Osteoarthritis of right knee, unspecified osteoarthritis type   Rationale for Evaluation and Treatment: Rehabilitation  THERAPY DIAG:  Right knee pain, unspecified chronicity  Muscle weakness (generalized)  Difficulty in walking, not elsewhere classified  ONSET DATE: within 3 months --------------------------------------------------------------------------------------------- SUBJECTIVE:  SUBJECTIVE STATEMENT: Today: Pt states she only  has to return to orthopedist as needed.  Currently without pain, completing HEP. Reports she is not using any AD for ambulation.   IE: Within the last 3 months, patient's right knee pain has increased and has been popping. Cortisone shot to the right knee in October 2024. Patient has been referred to OPPT. (Daughter, Misty Stanley present).    PERTINENT HISTORY:  Morbid obesity  PAIN:  Are you having pain? Yes: NPRS scale: 6-7/10 Pain location: medial right knee  Pain description: dully/achy Aggravating factors: walking Relieving factors: sitting  PRECAUTIONS: None  RED FLAGS: None   WEIGHT BEARING RESTRICTIONS: No  FALLS:  Has patient fallen in last 6 months? No  LIVING ENVIRONMENT: Lives with: lives alone Lives in: House/apartment Stairs: Yes: External: 2 steps; none Has following equipment at home: Single point cane and Walker - 2 wheeled  OCCUPATION: retired  PLOF:  Independent  PATIENT GOALS: Patient wants to avoid right knee surgery   NEXT MD VISIT: TBD  --------------------------------------------------------------------------------------------- OBJECTIVE:   DIAGNOSTIC FINDINGS:  N/a  PATIENT SURVEYS:  LEFS 25/80  SCREENING FOR RED FLAGS: Bowel or bladder incontinence: No Spinal tumors: No Cauda equina syndrome: No Compression fracture: No Abdominal aneurysm: No  COGNITION: Overall cognitive status: Within functional limits for tasks assessed  POSTURE: No Significant postural limitations      FUNCTIONAL TESTS:  5 times sit to stand: 19.23s with upper extremity assist  2 minute walk test: next session   GAIT ANALYSIS: Distance walked: 81ft Assistive device utilized: None Level of assistance: Complete Independence Comments: Patient with moderate right lower extremity antalgia  SENSATION: WFL    LOWER EXTREMITY MMT:    MMT Right eval Left eval  Hip flexion 3/5   Hip extension    Hip abduction    Hip adduction    Hip internal rotation    Hip external rotation    Knee flexion 3-/5 4-/5  Knee extension 3+/5 4-/5  Ankle dorsiflexion    Ankle plantarflexion    Ankle inversion    Ankle eversion     (Blank rows = not tested)  LOWER EXTREMITY ROM:     Active  Right eval Left eval  Hip flexion    Hip extension    Hip abduction    Hip adduction    Hip internal rotation    Hip external rotation    Knee flexion 0-80 0-90  Knee extension    Ankle dorsiflexion    Ankle plantarflexion    Ankle inversion    Ankle eversion     (Blank rows = not tested)  LOWER EXTREMITY SPECIAL TESTS Patellafemoral grind test: positive      PALPATION: Moderate tenderness to palpation right medial knee Moderate hypermobility patella with lateral tracking  INTEGUMENTARY   WFL --------------------------------------------------------------------------------------------- TODAY'S TREATMENT:  DATE: 01/09/24 Nustep seat 9 UE 7 level 4 [place] X5 minutes Bike for ROM seat 12 rocking with flexion holds for Rt knee Standing:Rt knee flexion stretch 12" step 10X10"  Forward step up with bil UE assist 6" step 10X each  Lateral step up 4" step with bil UE assist 10X each Bodycraft set with 2 holes showing 7PL 2X10 with stretch into flexion  01/02/24 Seated NuStep Level 2, seat 10,  warm-up Leg press with 50#, then 70#, then 90# x10 each   Minimal assist to get into machine  Leg extension machine with 20# x10  Supine  LAQS with 5# 2x10   12/26/23 Right Knee flexion active range of motion 0-75 at start of session; 0-80 after Supine/ prone Manual therapy to right knee: instrument-assisted soft tissue mobilization/ soft tissue mobilization to knee muscles, joint mobilizations grade 2-3 for knee extension/flexion ROM, PROM of knee, mobilization with movement into knee flexion  Leg curls with 2.5# 2x10 Moist hot pack to right knee 5'   12/19/23 Recumbent bike, seat 13, level 1, rocking x 3' Gr 2 AP/PA tibiofemoral glide x 30" x 3 each Gr 2 patellar mob on all planes x 2' total Heel slides with a strap x 10 x 3 x 30" hold at the beginning of each set  Supine passive R piriformis stretch x 30" x 2 Supine R piriformis stretch with a strap x 30"  Seated adductor squeeze x 3" x 10 x 2   PATIENT EDUCATION:  Education details: HEP Person educated: Patient Education method: Explanation Education comprehension: verbalized understanding  HOME EXERCISE PROGRAM: Access Code: [email or web address] URL: [email or web address] 12/19/23 - Supine Heel Slide with Strap (Mirrored)  - 1-2 x daily - 5-7 x weekly - 2 sets - 10 reps (with 30 sec hold at the beginning of each set) - Sidelying Piriformis Stretch  - 1-2 x daily - 5-7 x weekly - 3 reps - 30 hold - Seated Hip Adduction Isometrics with Ball  -  1-2 x daily - 5-7 x weekly - 2 sets - 10 reps - 3 hold Date: 12/06/2023 Prepared by: Seymour Bars  Exercises - Supine Quad Set  - 1-2 x daily - 2 sets - 10 reps - Supine Heel Slide with Strap (Mirrored)  - 1-2 x daily - 2 sets - 10 reps - Sitting Knee Extension with Resistance  - 1-2 x daily - 7 x weekly - 2 sets - 10 reps  Patient Education - Paediatric nurse with Cane ----------------------------------------------------------------------------------------------  ASSESSMENT:  CLINICAL IMPRESSION: Continued with focus on LE strengthening and improvement of bil knee flexion.  Pt still unable to make full revolution on bike with ROM measured at 100 degrees for Lt knee and 82 for Rt at end of session today.  Added standing knee flexion stretch and had hold stretch with leg press to increase flexion as well.  Began forward and lateral step ups to activate quadriceps.  Too difficult with 6" step for laterals having to reduce to 4".  Cues needed for form and posturing with therex.  No pain or issues during session or at end of treatment today.  Pt will need a reassessment next session to determine further need for therapy.  IE: Patient is a 68 y.o. y.o. female who was seen today for physical therapy evaluation and treatment for right knee pain, weakness, difficulty walking. Patient presents to PT with the following objective impairments: Abnormal gait, decreased activity tolerance, decreased mobility, difficulty walking, decreased ROM, decreased strength,  improper body mechanics, obesity, and pain. These impairments limit the patient in activities such as carrying, lifting, bending, standing, squatting, stairs, transfers, and locomotion level. These impairments also limit the patient in participation such as meal prep, cleaning, laundry, interpersonal relationship, driving, shopping, community activity, and yard work. The patient will benefit from PT to address the limitations/impairments listed below to  return to their prior level of function in the domains of activity and participation.    PERSONAL FACTORS:  obesity  are also affecting patient's functional outcome.   REHAB POTENTIAL: Good  CLINICAL DECISION MAKING: Stable/uncomplicated  EVALUATION COMPLEXITY: Low  --------------------------------------------------------------------------------------------- GOALS: Goals reviewed with patient? No  SHORT TERM GOALS: Target date: 12/19/2023   1. Patient will be independent with a basic stretching/strengthening HEP  Baseline:  Goal status: INITIAL 2. Patient will be able to demonstrate right knee flexion active range of motion to 0-85 degrees to facilitate ADL completion, lifting, squatting. Baseline:  Goal status: INITIAL   LONG TERM GOALS: Target date: 01/09/2024    Patient will score a >/= 35/80 on the LEFS    to demonstrate a Minimally Clinically Importance Difference (MCID) in ADL completion, home/community ambulation, and lifting/bending/squatting. Baseline:  Goal status: INITIAL  2.  Patient will complete the 5 times sit to stand:    within 16s to demonstrate an improvement in ADL completion, stair negotiation, household/community ambulation, and self-care Baseline:  Goal status: INITIAL  3.  Patient will be independent with a comprehensive strengthening HEP  Baseline:  Goal status: INITIAL  4. Patient will be able to demonstrate right knee flexion active range of motion to 0-100 degrees to facilitate ADL completion, lifting, squatting. Baseline:  Goal status: INITIAL  --------------------------------------------------------------------------------------------- PLAN:  PT FREQUENCY: 1x/week  PT DURATION: 6 weeks  PLANNED INTERVENTIONS: 97110-Therapeutic exercises, 97530- Therapeutic activity, 97112- Neuromuscular re-education, 97535- Self Care, 16109- Manual therapy, [phone]- Gait training, Patient/Family education, Balance training, Stair training, Taping, Dry  Needling, Joint mobilization, Joint manipulation, Spinal manipulation, Spinal mobilization, DME instructions, Cryotherapy, and Moist heat.  PLAN FOR NEXT SESSION: Re-evaluate next session and recert if needed.    Lurena Nida, PTA/CLT [facility] Ph: [phone] 01/09/2024, 3:35 PM

## 2024-01-16 ENCOUNTER — Encounter (HOSPITAL_COMMUNITY): Payer: Self-pay | Admitting: Occupational Therapy

## 2024-01-16 ENCOUNTER — Ambulatory Visit (HOSPITAL_COMMUNITY): Payer: HMO

## 2024-01-16 ENCOUNTER — Ambulatory Visit (HOSPITAL_COMMUNITY): Payer: HMO | Admitting: Occupational Therapy

## 2024-01-16 DIAGNOSIS — M25611 Stiffness of right shoulder, not elsewhere classified: Secondary | ICD-10-CM

## 2024-01-16 DIAGNOSIS — M6281 Muscle weakness (generalized): Secondary | ICD-10-CM

## 2024-01-16 DIAGNOSIS — M25561 Pain in right knee: Secondary | ICD-10-CM | POA: Diagnosis not present

## 2024-01-16 DIAGNOSIS — R262 Difficulty in walking, not elsewhere classified: Secondary | ICD-10-CM

## 2024-01-16 DIAGNOSIS — R29898 Other symptoms and signs involving the musculoskeletal system: Secondary | ICD-10-CM

## 2024-01-16 DIAGNOSIS — G8929 Other chronic pain: Secondary | ICD-10-CM

## 2024-01-16 NOTE — Therapy (Signed)
OUTPATIENT OCCUPATIONAL THERAPY ORTHO TREATMENT   Patient Name: Karina Sawyer MRN: [id number] DOB:15-Jan-1956, 68 y.o., female Today's Date: 01/16/2024   END OF SESSION:  OT End of Session - 01/16/24 1012     Visit Number 8    Number of Visits 9    Date for OT Re-Evaluation 01/25/24    Authorization Type [employer]    Progress Note Due on Visit 10    OT Start Time 0930    OT Stop Time 1011    OT Time Calculation (min) 41 min    Activity Tolerance Patient tolerated treatment well    Behavior During Therapy [facility] for tasks assessed/performed               Past Medical History:  Diagnosis Date   Abnormal results of liver function studies    Acute maxillary sinusitis    around 2003   Arthritis    knees   Chest pain    Chest pain 2022   Per pt, she experienced a burning sensation in her chest and an abnormal EKG dated 04/26/21. She was sent to Dr. Anne Fu, cardiologist on 04/26/21. Echocardiogram demonstrated LVEF  55 - 60% and severe left atrial dilation. Patient states that she was told everything was fine, and that her chest pain was likely acid reflux.   Heart murmur    many years ago told had a murmur but no one recenctly mentioned   History of kidney stones 2003   Hyperlipidemia    Hypertension    Follows w/ PCP, Dr. Catalina Pizza takes HCTZ.   Hypothyroidism    Follows with PCP, Dr. Catalina Pizza. Currently taking Synthroid.   Menopausal and female climacteric states    Morbid obesity (HCC)    PONV (postoperative nausea and vomiting)    Patient states severe nausea and vomiting after anesthesia.   Wears glasses    reading only   Past Surgical History:  Procedure Laterality Date   APPENDECTOMY  1971   COLONOSCOPY  2015   [facility] - no polyps per pt   DILATATION & CURETTAGE/HYSTEROSCOPY WITH MYOSURE N/A 12/05/2022   Procedure: DILATATION & CURETTAGE/HYSTEROSCOPY WITH MYOSURE LITE;  Surgeon: Charlett Nose, MD;  Location: [facility] [place];   Service: Gynecology;  Laterality: N/A;  Device needed = myosure lite   NECK SURGERY     growth removed at birth   ovary removed     age 73   TUBAL LIGATION  08/24/1989   early 68's   WISDOM TOOTH EXTRACTION     many years ago   There are no active problems to display for this patient.   PCP: Dr. Nita Sells REFERRING PROVIDER: Dr. Margarita Rana  ONSET DATE: several months  REFERRING DIAG: M19.011 (ICD-10-CM) - Osteoarthritis of right shoulder, unspecified osteoarthritis type   THERAPY DIAG:  Stiffness of right shoulder, not elsewhere classified  Chronic right shoulder pain  Other symptoms and signs involving the musculoskeletal system  Rationale for Evaluation and Treatment: Rehabilitation  SUBJECTIVE:   SUBJECTIVE STATEMENT: S: "Its not hurting so that's always good."  PERTINENT HISTORY: Pt is a 68 y/o female presenting with chronic right shoulder pain. Pt has received several injections, 2 in the last month one of which was US guided, with some relief. Pt would like to try conservative treatment before opting for TSA.   PRECAUTIONS: None  WEIGHT BEARING RESTRICTIONS: No  PAIN:  Are you having pain? No  FALLS: Has patient fallen in last 6  months? No  PLOF: Independent  PATIENT GOALS: To be able to move my arm more.   NEXT MD VISIT: None  OBJECTIVE:   HAND DOMINANCE: Right  ADLs: Overall ADLs: I can't hardly brush my teeth or brush my hair. My hand shakes when holding a cup of water. Pt has difficulty dressing and undressing, has to have help unhooking bra. Pt is unable to reach overhead or behind back.   FUNCTIONAL OUTCOME MEASURES: FOTO: 45/100 12/26/23: 56/100  UPPER EXTREMITY ROM:       Assessed in sitting, er/IR adducted  Active ROM Right eval Right 12/26/23  Shoulder flexion 94 115  Shoulder abduction 103 123  Shoulder internal rotation 90 90  Shoulder external rotation 18 25  (Blank rows = not tested)  Assessed in supine, er/IR  adducted  Passive ROM Right eval Right 12/26/23  Shoulder flexion 110 132  Shoulder abduction 108 127  Shoulder internal rotation 90 90  Shoulder external rotation 20 20  (Blank rows = not tested)    UPPER EXTREMITY MMT:     Assessed in sitting, er/IR adducted  MMT Right eval Right 12/26/23  Shoulder flexion 3-/5 4/5  Shoulder abduction 3-/5 3+/5  Shoulder internal rotation 3/5 5/5  Shoulder external rotation 3-/5 4-/5  (Blank rows = not tested)  HAND FUNCTION: Grip strength: Right: 48 lbs; Left: 45 lbs  OBSERVATIONS: mod fascial restrictions along right upper arm, trapezius, and scapular regions; degenerative creptius noted with mobility   TODAY'S TREATMENT:                                                                                                                              DATE:   01/16/24 -Myofascial release to right upper arm, anterior shoulder, trapezius, and scapular regions to decrease pain and fascial restrictions and increase joint ROM -P/ROM: supine-flexion, abduction, er, 5 reps  -Shoulder strengthening: 1#, supine-protraction, flexion, er/IR, abduction, 10 reps -A/ROM: supine-horizontal abduction, abduction, 10 reps -Proximal shoulder strengthening: supine-paddles, criss cross, circles each direction -Scapular A/ROM: row, extension, 10 reps -A/ROM: standing-protraction, flexion, abduction, er, horizontal abduction, 10 reps -Scapular theraband: red-row, extension, retraction, 10 reps  01/09/24 -Myofascial release to right upper arm, anterior shoulder, trapezius, and scapular regions to decrease pain and fascial restrictions and increase joint ROM -P/ROM: supine-flexion, abduction, er, 5 reps  -A/ROM: supine-protraction, flexion, horizontal abduction, er/IR, abduction, 10 reps -Proximal shoulder strengthening: supine-paddles, criss cross, circles each direction -Functional reaching: pt placing 8 cones on middle shelf of overhead cabinet in flexion,  removing in abduction -Overhead lacing: seated-lacing from top down then reversing -Scapular theraband: row, extension, retraction, 10 reps -UBE: level 2, 3' forward 3' reverse, pace: 6.5  01/02/24 -Myofascial release to right upper arm, anterior shoulder, trapezius, and scapular regions to decrease pain and fascial restrictions and increase joint ROM -AA/ROM: supine-protraction, flexion, abduction, horizontal abduction, er, x12 -A/ROM: seated-protraction, flexion, horizontal abduction, er/IR, abduction, x10 -Isometrics: flexion, extension, abduction, IR, 4x10"  PATIENT EDUCATION: Education details: scapular theraband Person educated: Patient and Child(ren) Education method: Explanation, Demonstration, and Handouts Education comprehension: verbalized understanding and returned demonstration  HOME EXERCISE PROGRAM: Carley Hammed: AA/ROM 12/26/23: A/ROM 01/02/24: Isometrics 01/16/24: red scapular theraband  GOALS: Goals reviewed with patient? Yes   SHORT TERM GOALS: Target date: 12/28/23  Pt will be provided with and educated on HEP to improve mobility in RUE required for use during ADL completion.   Goal status: IN PROGRESS  2.  Pt will increase RUE A/ROM by 25 degrees to improve ability to use RUE during dressing tasks with minimal compensatory techniques.   Goal status: REVISED  3.  Pt will increase RUE strength to 4/5 to improve ability to reach for items at waist to chest height during dressing, bathing, and grooming tasks.   Goal status: REVISED  4.  Pt will decrease pain in RUE to 3/10 or less to improve ability to perform grooming and bathing tasks with minimal discomfort.   Goal status: MET  5.  Pt will decrease RUE fascial restrictions to min amounts or less to improve mobility required for functional reaching tasks.   Goal status: IN PROGRESS   ASSESSMENT:  CLINICAL IMPRESSION: Pt reports she is now able to reach a plate on the second shelf of her cabinets, continues to  have min to no pain. Continued with manual techniques, added low 1# weight to supine tasks with exception of abduction and horizontal abduction. Pt with intermittent popping during session. Completed scapular theraband and added to HEP. Verbal cuing for form and technique. ROM to approximately 60% for flexion and abduction today.   PERFORMANCE DEFICITS: in functional skills including in functional skills including ADLs, IADLs, coordination, tone, ROM, strength, pain, fascial restrictions, muscle spasms, and UE functional use   PLAN:  OT FREQUENCY: 1x/week  OT DURATION: 4 weeks  PLANNED INTERVENTIONS: 97168 OT Re-evaluation, 97535 self care/ADL training, 14782 therapeutic exercise, 97530 therapeutic activity, 97140 manual therapy, 97014 electrical stimulation unattended, patient/family education, and DME and/or AE instructions  CONSULTED AND AGREED WITH PLAN OF CARE: Patient  PLAN FOR NEXT SESSION: Follow up on HEP, manual techniques, A/ROM, functional reaching    [employer], OTR/L  [phone] 01/16/2024, 10:12 AM

## 2024-01-16 NOTE — Therapy (Signed)
[facility] OUTPATIENT PHYSICAL THERAPY TREATMENT    Patient Name: Karina Sawyer MRN: [id number] DOB:May 15, 1956, 68 y.o., female Today's Date: 01/16/2024  END OF SESSION:   PT End of Session - 01/16/24 1019     Visit Number 6    Number of Visits 12    Date for PT Re-Evaluation 02/27/24    Authorization Type [employer]    Authorization Time Period no auth    Progress Note Due on Visit 12    PT Start Time 1015    PT Stop Time 1055    PT Time Calculation (min) 40 min    Activity Tolerance Patient tolerated treatment well    Behavior During Therapy [facility] for tasks assessed/performed             Past Medical History:  Diagnosis Date   Abnormal results of liver function studies    Acute maxillary sinusitis    around 2003   Arthritis    knees   Chest pain    Chest pain 2022   Per pt, she experienced a burning sensation in her chest and an abnormal EKG dated 04/26/21. She was sent to Dr. Anne Fu, cardiologist on 04/26/21. Echocardiogram demonstrated LVEF  55 - 60% and severe left atrial dilation. Patient states that she was told everything was fine, and that her chest pain was likely acid reflux.   Heart murmur    many years ago told had a murmur but no one recenctly mentioned   History of kidney stones 2003   Hyperlipidemia    Hypertension    Follows w/ PCP, Dr. Catalina Pizza takes HCTZ.   Hypothyroidism    Follows with PCP, Dr. Catalina Pizza. Currently taking Synthroid.   Menopausal and female climacteric states    Morbid obesity (HCC)    PONV (postoperative nausea and vomiting)    Patient states severe nausea and vomiting after anesthesia.   Wears glasses    reading only   Past Surgical History:  Procedure Laterality Date   APPENDECTOMY  1971   COLONOSCOPY  2015   [facility] - no polyps per pt   DILATATION & CURETTAGE/HYSTEROSCOPY WITH MYOSURE N/A 12/05/2022   Procedure: DILATATION & CURETTAGE/HYSTEROSCOPY WITH MYOSURE LITE;  Surgeon: Charlett Nose, MD;  Location:  [facility];  Service: Gynecology;  Laterality: N/A;  Device needed = myosure lite   NECK SURGERY     growth removed at birth   ovary removed     age 75   TUBAL LIGATION  08/24/1989   early 35's   WISDOM TOOTH EXTRACTION     many years ago   There are no active problems to display for this patient.   PCP: Benita Stabile, MDPCP - General   REFERRING PROVIDER: Sheral Apley, MDRef Provider   REFERRING DIAG: M17.11 (ICD-10-CM) - Osteoarthritis of right knee, unspecified osteoarthritis type   Rationale for Evaluation and Treatment: Rehabilitation  THERAPY DIAG:  No diagnosis found.  ONSET DATE: within 3 months --------------------------------------------------------------------------------------------- SUBJECTIVE:  SUBJECTIVE STATEMENT: Progress Note 01/16/24:  Patient states she has seen improvement in her overall knee mobility and pain levels. Patient is still unable to do stairs or unable to stand long during ADLs.     IE: Within the last 3 months, patient's right knee pain has increased and has been popping. Cortisone shot to the right knee in October 2024. Patient has been referred to OPPT. (Daughter, Misty Stanley present).    PERTINENT HISTORY:  Morbid obesity  PAIN:  Are you having pain? Yes: NPRS scale: 6-7/10 Pain location: medial right knee  Pain description: dully/achy Aggravating factors: walking Relieving factors: sitting  PRECAUTIONS: None  RED FLAGS: None   WEIGHT BEARING RESTRICTIONS: No  FALLS:  Has patient fallen in last 6 months? No  LIVING ENVIRONMENT: Lives with: lives alone Lives in: House/apartment Stairs: Yes: External: 2 steps; none Has following equipment at home: Single point cane and Walker - 2 wheeled  OCCUPATION: retired  PLOF:  Independent  PATIENT GOALS: Patient wants to avoid right knee surgery   NEXT MD VISIT: TBD  --------------------------------------------------------------------------------------------- OBJECTIVE:   DIAGNOSTIC FINDINGS:  N/a  PATIENT SURVEYS:  LEFS 25/80  01/16/24 Lower Extremity Functional Scale (LEFS) 33/80  SCREENING FOR RED FLAGS: Bowel or bladder incontinence: No Spinal tumors: No Cauda equina syndrome: No Compression fracture: No Abdominal aneurysm: No  COGNITION: Overall cognitive status: Within functional limits for tasks assessed  POSTURE: No Significant postural limitations      FUNCTIONAL TESTS:  5 times sit to stand: 19.23s with upper extremity assist    01/16/24 5 times sit to stand: 18s with NO upper extremity assistance  2 MWT: 375 with moderate antlagia on right lower extremity; right trunk lateral shift    GAIT ANALYSIS: Distance walked: 33ft Assistive device utilized: None Level of assistance: Complete Independence Comments: Patient with moderate right lower extremity antalgia  SENSATION: WFL    LOWER EXTREMITY MMT:    MMT Right eval Left eval  Hip flexion 3/5   Hip extension    Hip abduction    Hip adduction    Hip internal rotation    Hip external rotation    Knee flexion 3-/5 4-/5  Knee extension 3+/5 4-/5  Ankle dorsiflexion    Ankle plantarflexion    Ankle inversion    Ankle eversion     (Blank rows = not tested)  LOWER EXTREMITY ROM:     Active  Right eval Left eval 01/16/24 Right   Hip flexion     Hip extension     Hip abduction     Hip adduction     Hip internal rotation     Hip external rotation     Knee flexion 0-80 0-90 0-82  Knee extension     Ankle dorsiflexion     Ankle plantarflexion     Ankle inversion     Ankle eversion      (Blank rows = not tested)  LOWER EXTREMITY SPECIAL TESTS Patellafemoral grind test: positive      PALPATION: Moderate tenderness to palpation right medial knee Moderate  hypermobility patella with lateral tracking  INTEGUMENTARY   WFL --------------------------------------------------------------------------------------------- TODAY'S TREATMENT:  DATE:  01/16/24 PT Progress Note Supine  Manual therapy to right knee: mobilization with movement into flexion    01/09/24 Nustep seat 9 UE 7 level 4 [place] X5 minutes Bike for ROM seat 12 rocking with flexion holds for Rt knee Standing:Rt knee flexion stretch 12" step 10X10"  Forward step up with bil UE assist 6" step 10X each  Lateral step up 4" step with bil UE assist 10X each [employer] set with 2 holes showing 7PL 2X10 with stretch into flexion  01/02/24 Seated NuStep Level 2, seat 10,  warm-up Leg press with 50#, then 70#, then 90# x10 each   Minimal assist to get into machine  Leg extension machine with 20# x10  Supine  LAQS with 5# 2x10   12/26/23 Right Knee flexion active range of motion 0-75 at start of session; 0-80 after Supine/ prone Manual therapy to right knee: instrument-assisted soft tissue mobilization/ soft tissue mobilization to knee muscles, joint mobilizations grade 2-3 for knee extension/flexion ROM, PROM of knee, mobilization with movement into knee flexion  Leg curls with 2.5# 2x10 Moist hot pack to right knee 5'     PATIENT EDUCATION:  Education details: HEP Person educated: Patient Education method: Explanation Education comprehension: verbalized understanding  HOME EXERCISE PROGRAM: Access Code: [email or web address] URL: [email or web address] Date: 01/16/2024 Prepared by: Seymour Bars  Exercises - Supine Quad Set  - 1-2 x daily - 2 sets - 10 reps - Sitting Knee Extension with Resistance  - 1-2 x daily - 7 x weekly - 2 sets - 10 reps - Supine Heel Slide with Strap (Mirrored)  - 1-2 x daily - 5-7 x weekly - 2 sets - 10 reps -  Sidelying Piriformis Stretch  - 1-2 x daily - 5-7 x weekly - 3 reps - 30 hold - Seated Hip Adduction Isometrics with Ball  - 1-2 x daily - 5-7 x weekly - 2 sets - 10 reps - 3 hold  Patient Education - Paediatric nurse with Cane ----------------------------------------------------------------------------------------------  ASSESSMENT:  CLINICAL IMPRESSION: .Patient has shown overall improvements in knee active range of motion by 2*, Lower Extremity Functional Scale (LEFS) score, 5TSTS time. Patient has completed (6) visits since the initial evaluation and has met 2/6 stated rehab goal. At this moment, PT to re-cert patient for 1x/week for 6 weeks to address remaining stated rehab goals.   IE: Patient is a 68 y.o. y.o. female who was seen today for physical therapy evaluation and treatment for right knee pain, weakness, difficulty walking. Patient presents to PT with the following objective impairments: Abnormal gait, decreased activity tolerance, decreased mobility, difficulty walking, decreased ROM, decreased strength, improper body mechanics, obesity, and pain. These impairments limit the patient in activities such as carrying, lifting, bending, standing, squatting, stairs, transfers, and locomotion level. These impairments also limit the patient in participation such as meal prep, cleaning, laundry, interpersonal relationship, driving, shopping, community activity, and yard work. The patient will benefit from PT to address the limitations/impairments listed below to return to their prior level of function in the domains of activity and participation.    PERSONAL FACTORS:  obesity  are also affecting patient's functional outcome.   REHAB POTENTIAL: Good  CLINICAL DECISION MAKING: Stable/uncomplicated  EVALUATION COMPLEXITY: Low  --------------------------------------------------------------------------------------------- GOALS: Goals reviewed with patient? No  SHORT TERM GOALS: Target  date: 12/19/2023   1. Patient will be independent with a basic stretching/strengthening HEP  Baseline:  Goal status: goal met 2. Patient will be able to demonstrate right knee  flexion active range of motion to 0-85 degrees to facilitate ADL completion, lifting, squatting. Baseline:  Goal status: goal partially met   LONG TERM GOALS: Target date: 01/09/2024  --> 02/27/2024      Patient will score a >/= 35/80 on the LEFS    to demonstrate a Minimally Clinically Importance Difference (MCID) in ADL completion, home/community ambulation, and lifting/bending/squatting. Baseline:  Goal status: goal met  2.  Patient will complete the 5 times sit to stand:    within 16s to demonstrate an improvement in ADL completion, stair negotiation, household/community ambulation, and self-care Baseline:  Goal status: goal partially met  3.  Patient will be independent with a comprehensive strengthening HEP  Baseline:  Goal status: goal partially met  4. Patient will be able to demonstrate right knee flexion active range of motion to 0-100 degrees to facilitate ADL completion, lifting, squatting. Baseline:  Goal status: goal partially met  --------------------------------------------------------------------------------------------- PLAN:  PT FREQUENCY: 1x/week  PT DURATION: 6 weeks  PLANNED INTERVENTIONS: 97110-Therapeutic exercises, 97530- Therapeutic activity, 97112- Neuromuscular re-education, 97535- Self Care, 16109- Manual therapy, [phone]- Gait training, Patient/Family education, Balance training, Stair training, Taping, Dry Needling, Joint mobilization, Joint manipulation, Spinal manipulation, Spinal mobilization, DME instructions, Cryotherapy, and Moist heat.  PLAN FOR NEXT SESSION: Progress lower extremity active range of motion/strength    Seymour Bars PT, DPT  01/16/2024, 10:21 AM

## 2024-01-16 NOTE — Patient Instructions (Signed)

## 2024-01-23 ENCOUNTER — Ambulatory Visit (HOSPITAL_COMMUNITY): Payer: HMO | Admitting: Occupational Therapy

## 2024-01-23 ENCOUNTER — Encounter (HOSPITAL_COMMUNITY): Payer: Self-pay | Admitting: Occupational Therapy

## 2024-01-23 ENCOUNTER — Ambulatory Visit (HOSPITAL_COMMUNITY): Payer: HMO | Admitting: Physical Therapy

## 2024-01-23 DIAGNOSIS — R29898 Other symptoms and signs involving the musculoskeletal system: Secondary | ICD-10-CM

## 2024-01-23 DIAGNOSIS — M25561 Pain in right knee: Secondary | ICD-10-CM | POA: Diagnosis not present

## 2024-01-23 DIAGNOSIS — M25611 Stiffness of right shoulder, not elsewhere classified: Secondary | ICD-10-CM

## 2024-01-23 DIAGNOSIS — G8929 Other chronic pain: Secondary | ICD-10-CM

## 2024-01-23 DIAGNOSIS — M6281 Muscle weakness (generalized): Secondary | ICD-10-CM

## 2024-01-23 NOTE — Patient Instructions (Signed)

## 2024-01-23 NOTE — Therapy (Signed)
OUTPATIENT OCCUPATIONAL THERAPY ORTHO TREATMENT REASSESSMENT/RECERTIFICATION   Patient Name: Karina Sawyer DOBROWOLSKI MRN: [id number] DOB:1956-05-24, 68 y.o., female Today's Date: 01/23/2024   END OF SESSION:  OT End of Session - 01/23/24 1507     Visit Number 9    Number of Visits 13    Date for OT Re-Evaluation 02/28/24    Authorization Type [employer]    Progress Note Due on Visit 10    OT Start Time 1353    OT Stop Time 1439    OT Time Calculation (min) 46 min    Activity Tolerance Patient tolerated treatment well    Behavior During Therapy [facility] for tasks assessed/performed             Past Medical History:  Diagnosis Date   Abnormal results of liver function studies    Acute maxillary sinusitis    around 2003   Arthritis    knees   Chest pain    Chest pain 2022   Per pt, she experienced a burning sensation in her chest and an abnormal EKG dated 04/26/21. She was sent to Dr. Anne Fu, cardiologist on 04/26/21. Echocardiogram demonstrated LVEF  55 - 60% and severe left atrial dilation. Patient states that she was told everything was fine, and that her chest pain was likely acid reflux.   Heart murmur    many years ago told had a murmur but no one recenctly mentioned   History of kidney stones 2003   Hyperlipidemia    Hypertension    Follows w/ PCP, Dr. Catalina Pizza takes HCTZ.   Hypothyroidism    Follows with PCP, Dr. Catalina Pizza. Currently taking Synthroid.   Menopausal and female climacteric states    Morbid obesity (HCC)    PONV (postoperative nausea and vomiting)    Patient states severe nausea and vomiting after anesthesia.   Wears glasses    reading only   Past Surgical History:  Procedure Laterality Date   APPENDECTOMY  1971   COLONOSCOPY  2015   [facility] - no polyps per pt   DILATATION & CURETTAGE/HYSTEROSCOPY WITH MYOSURE N/A 12/05/2022   Procedure: DILATATION & CURETTAGE/HYSTEROSCOPY WITH MYOSURE LITE;  Surgeon: Charlett Nose, MD;  Location:  [facility] [place];  Service: Gynecology;  Laterality: N/A;  Device needed = myosure lite   NECK SURGERY     growth removed at birth   ovary removed     age 66   TUBAL LIGATION  08/24/1989   early 30's   WISDOM TOOTH EXTRACTION     many years ago   There are no active problems to display for this patient.   PCP: Dr. Nita Sells REFERRING PROVIDER: Dr. Margarita Rana  ONSET DATE: several months  REFERRING DIAG: M19.011 (ICD-10-CM) - Osteoarthritis of right shoulder, unspecified osteoarthritis type   THERAPY DIAG:  Stiffness of right shoulder, not elsewhere classified  Chronic right shoulder pain  Other symptoms and signs involving the musculoskeletal system  Rationale for Evaluation and Treatment: Rehabilitation  SUBJECTIVE:   SUBJECTIVE STATEMENT: S: "It's popping a lot more today"  PERTINENT HISTORY: Pt is a 68 y/o female presenting with chronic right shoulder pain. Pt has received several injections, 2 in the last month one of which was US guided, with some relief. Pt would like to try conservative treatment before opting for TSA.   PRECAUTIONS: None  WEIGHT BEARING RESTRICTIONS: No  PAIN:  Are you having pain? No  FALLS: Has patient fallen in last 6 months? No  PLOF: Independent  PATIENT GOALS: To be able to move my arm more.   NEXT MD VISIT: None  OBJECTIVE:   HAND DOMINANCE: Right  ADLs: Overall ADLs: I can't hardly brush my teeth or brush my hair. My hand shakes when holding a cup of water. Pt has difficulty dressing and undressing, has to have help unhooking bra. Pt is unable to reach overhead or behind back.   FUNCTIONAL OUTCOME MEASURES: FOTO: 45/100 12/26/23: 56/100 01/23/24: 58.82/100  UPPER EXTREMITY ROM:       Assessed in sitting, er/IR adducted  Active ROM Right eval Right 12/26/23 Right 01/23/24  Shoulder flexion 94 115 133  Shoulder abduction 103 123 123  Shoulder internal rotation 90 90 90  Shoulder external rotation 18 25 37   (Blank rows = not tested)  Assessed in supine, er/IR adducted  Passive ROM Right eval Right 12/26/23  Shoulder flexion 110 132  Shoulder abduction 108 127  Shoulder internal rotation 90 90  Shoulder external rotation 20 20  (Blank rows = not tested)    UPPER EXTREMITY MMT:     Assessed in sitting, er/IR adducted  MMT Right eval Right 12/26/23 Right 01/23/24  Shoulder flexion 3-/5 4/5 4+/5  Shoulder abduction 3-/5 3+/5 4/5  Shoulder internal rotation 3/5 5/5 5/5  Shoulder external rotation 3-/5 4-/5 4/5  (Blank rows = not tested)  HAND FUNCTION: Grip strength: Right: 48 lbs; Left: 45 lbs  OBSERVATIONS: mod fascial restrictions along right upper arm, trapezius, and scapular regions; degenerative creptius noted with mobility   TODAY'S TREATMENT:                                                                                                                              DATE:   01/23/24 -Myofascial release to right upper arm, anterior shoulder, trapezius, and scapular regions to decrease pain and fascial restrictions and increase joint ROM -A/ROM: seated, flexion, abduction, protraction, horizontal abduction, er/IR, x15 -Proximal shoulder strengthening: supine-paddles, criss cross, circles each direction -Scapular A/ROM: row, extension, 10 reps -Measurements for reassessment  01/16/24 -Myofascial release to right upper arm, anterior shoulder, trapezius, and scapular regions to decrease pain and fascial restrictions and increase joint ROM -P/ROM: supine-flexion, abduction, er, 5 reps  -Shoulder strengthening: 1#, supine-protraction, flexion, er/IR, abduction, 10 reps -A/ROM: supine-horizontal abduction, abduction, 10 reps -Proximal shoulder strengthening: supine-paddles, criss cross, circles each direction -Scapular A/ROM: row, extension, 10 reps -A/ROM: standing-protraction, flexion, abduction, er, horizontal abduction, 10 reps -Scapular theraband: red-row, extension,  retraction, 10 reps  01/09/24 -Myofascial release to right upper arm, anterior shoulder, trapezius, and scapular regions to decrease pain and fascial restrictions and increase joint ROM -P/ROM: supine-flexion, abduction, er, 5 reps  -A/ROM: supine-protraction, flexion, horizontal abduction, er/IR, abduction, 10 reps -Proximal shoulder strengthening: supine-paddles, criss cross, circles each direction -Functional reaching: pt placing 8 cones on middle shelf of overhead cabinet in flexion, removing in abduction -Overhead lacing: seated-lacing from top down then reversing -Scapular theraband: row,  extension, retraction, 10 reps -UBE: level 2, 3' forward 3' reverse, pace: 6.5    PATIENT EDUCATION: Education details: shoulder stretches Person educated: Patient and Child(ren) Education method: Explanation, Demonstration, and Handouts Education comprehension: verbalized understanding and returned demonstration  HOME EXERCISE PROGRAM: Carley Hammed: AA/ROM 12/26/23: A/ROM 01/02/24: Isometrics 01/16/24: red scapular theraband  GOALS: Goals reviewed with patient? Yes   SHORT TERM GOALS: Target date: 12/28/23  Pt will be provided with and educated on HEP to improve mobility in RUE required for use during ADL completion.   Goal status: IN PROGRESS  2.  Pt will increase RUE A/ROM by 50 degrees to improve ability to use RUE during dressing tasks with minimal compensatory techniques.   Goal status: REVISED  3.  Pt will increase RUE strength to 4+/5 to improve ability to reach for items at waist to chest height during dressing, bathing, and grooming tasks.   Goal status: REVISED  4.  Pt will decrease pain in RUE to 3/10 or less to improve ability to perform grooming and bathing tasks with minimal discomfort.   Goal status: MET  5.  Pt will decrease RUE fascial restrictions to min amounts or less to improve mobility required for functional reaching tasks.   Goal status: IN  PROGRESS   ASSESSMENT:  CLINICAL IMPRESSION: Pt completing reassessment this session. She is continuing to make good improvements with ROM and strengthening, as well as reporting improved functional movements at home with ADL's and IADL's. OT revised her ROM and strength goal to continue improving/progressing her skills to more functional levels. Recommending continue skilled OT for 1x/ week for 4 more weeks. Verbal and tactile cuing provided this session for positioning and technique.   PERFORMANCE DEFICITS: in functional skills including in functional skills including ADLs, IADLs, coordination, tone, ROM, strength, pain, fascial restrictions, muscle spasms, and UE functional use   PLAN:  OT FREQUENCY: 1x/week  OT DURATION: 4 weeks  PLANNED INTERVENTIONS: 97168 OT Re-evaluation, 97535 self care/ADL training, 16109 therapeutic exercise, 97530 therapeutic activity, 97140 manual therapy, 97014 electrical stimulation unattended, patient/family education, and DME and/or AE instructions  CONSULTED AND AGREED WITH PLAN OF CARE: Patient  PLAN FOR NEXT SESSION: Follow up on HEP, manual techniques, A/ROM, functional reaching    Trish Mage, OTR/L [phone] 01/23/2024, 3:08 PM

## 2024-01-23 NOTE — Therapy (Signed)
[facility] OUTPATIENT PHYSICAL THERAPY TREATMENT    Patient Name: Karina Sawyer MRN: [id number] DOB:[birth year], 68 y.o., female Today's Date: 01/23/2024  END OF SESSION:   PT End of Session - 01/23/24 1308     Visit Number 7    Number of Visits 12    Date for PT Re-Evaluation 02/27/24    Authorization Type [employer]    Authorization Time Period no auth    Progress Note Due on Visit 12    PT Start Time 1306    PT Stop Time 1348    PT Time Calculation (min) 42 min    Activity Tolerance Patient tolerated treatment well    Behavior During Therapy [facility] for tasks assessed/performed             Past Medical History:  Diagnosis Date   Abnormal results of liver function studies    Acute maxillary sinusitis    around 2003   Arthritis    knees   Chest pain    Chest pain 2022   Per pt, she experienced a burning sensation in her chest and an abnormal EKG dated 04/26/21. She was sent to Dr. Anne Fu, cardiologist on 04/26/21. Echocardiogram demonstrated LVEF  55 - 60% and severe left atrial dilation. Patient states that she was told everything was fine, and that her chest pain was likely acid reflux.   Heart murmur    many years ago told had a murmur but no one recenctly mentioned   History of kidney stones 2003   Hyperlipidemia    Hypertension    Follows w/ PCP, Dr. Catalina Pizza takes HCTZ.   Hypothyroidism    Follows with PCP, Dr. Catalina Pizza. Currently taking Synthroid.   Menopausal and female climacteric states    Morbid obesity (HCC)    PONV (postoperative nausea and vomiting)    Patient states severe nausea and vomiting after anesthesia.   Wears glasses    reading only   Past Surgical History:  Procedure Laterality Date   APPENDECTOMY  1971   COLONOSCOPY  2015    GI - no polyps per pt   DILATATION & CURETTAGE/HYSTEROSCOPY WITH MYOSURE N/A 12/05/2022   Procedure: DILATATION & CURETTAGE/HYSTEROSCOPY WITH MYOSURE LITE;  Surgeon: Charlett Nose, MD;  Location:  [facility] [place];  Service: Gynecology;  Laterality: N/A;  Device needed = myosure lite   NECK SURGERY     growth removed at birth   ovary removed     age 66   TUBAL LIGATION  08/24/1989   early 29's   WISDOM TOOTH EXTRACTION     many years ago   There are no active problems to display for this patient.   PCP: Benita Stabile, MDPCP - General   REFERRING PROVIDER: Sheral Apley, MDRef Provider   REFERRING DIAG: M17.11 (ICD-10-CM) - Osteoarthritis of right knee, unspecified osteoarthritis type   Rationale for Evaluation and Treatment: Rehabilitation  THERAPY DIAG:  Right knee pain, unspecified chronicity  Muscle weakness (generalized)  ONSET DATE: within 3 months --------------------------------------------------------------------------------------------- SUBJECTIVE:  SUBJECTIVE STATEMENT:  01/23/24:  PT states she is doing well, just tightness not hurting.  Exercises at home going well.    Progress Note 01/16/24:  Patient states she has seen improvement in her overall knee mobility and pain levels. Patient is still unable to do stairs or unable to stand long during ADLs.     IE: Within the last 3 months, patient's right knee pain has increased and has been popping. Cortisone shot to the right knee in October 2024. Patient has been referred to OPPT. (Daughter, Misty Stanley present).    PERTINENT HISTORY:  Morbid obesity  PAIN:  Are you having pain? Yes: NPRS scale: 6-7/10 Pain location: medial right knee  Pain description: dully/achy Aggravating factors: walking Relieving factors: sitting  PRECAUTIONS: None  RED FLAGS: None   WEIGHT BEARING RESTRICTIONS: No  FALLS:  Has patient fallen in last 6 months? No  LIVING ENVIRONMENT: Lives with: lives alone Lives in:  House/apartment Stairs: Yes: External: 2 steps; none Has following equipment at home: Single point cane and Walker - 2 wheeled  OCCUPATION: retired  PLOF: Independent  PATIENT GOALS: Patient wants to avoid right knee surgery   NEXT MD VISIT: TBD  --------------------------------------------------------------------------------------------- OBJECTIVE:   DIAGNOSTIC FINDINGS:  N/a  PATIENT SURVEYS:  LEFS 25/80  01/16/24 Lower Extremity Functional Scale (LEFS) 33/80  SCREENING FOR RED FLAGS: Bowel or bladder incontinence: No Spinal tumors: No Cauda equina syndrome: No Compression fracture: No Abdominal aneurysm: No  COGNITION: Overall cognitive status: Within functional limits for tasks assessed  POSTURE: No Significant postural limitations      FUNCTIONAL TESTS:  5 times sit to stand: 19.23s with upper extremity assist    01/16/24 5 times sit to stand: 18s with NO upper extremity assistance  2 MWT: 375 with moderate antlagia on right lower extremity; right trunk lateral shift    GAIT ANALYSIS: Distance walked: 22ft Assistive device utilized: None Level of assistance: Complete Independence Comments: Patient with moderate right lower extremity antalgia  SENSATION: WFL    LOWER EXTREMITY MMT:    MMT Right eval Left eval  Hip flexion 3/5   Hip extension    Hip abduction    Hip adduction    Hip internal rotation    Hip external rotation    Knee flexion 3-/5 4-/5  Knee extension 3+/5 4-/5  Ankle dorsiflexion    Ankle plantarflexion    Ankle inversion    Ankle eversion     (Blank rows = not tested)  LOWER EXTREMITY ROM:     Active  Right eval Left eval 01/16/24 Right   Hip flexion     Hip extension     Hip abduction     Hip adduction     Hip internal rotation     Hip external rotation     Knee flexion 0-80 0-90 0-82  Knee extension     Ankle dorsiflexion     Ankle plantarflexion     Ankle inversion     Ankle eversion      (Blank rows = not  tested)  LOWER EXTREMITY SPECIAL TESTS Patellafemoral grind test: positive      PALPATION: Moderate tenderness to palpation right medial knee Moderate hypermobility patella with lateral tracking  INTEGUMENTARY   WFL --------------------------------------------------------------------------------------------- TODAY'S TREATMENT:  DATE: 01/23/24 Nustep seat 9 UE 7 [place] 5 minutes Standing: Rt knee flexion stretch 12" 10X10"  Forward step ups bil UE assist 6" step 10X each  Lateral step ups bil UE assist 4" step 10X each Bodycraft set with 2 holes showing 7PL 2X10 with stretch into flexion  01/16/24 PT Progress Note Supine  Manual therapy to right knee: mobilization with movement into flexion    01/09/24 Nustep seat 9 UE 7 level 4 [place] X5 minutes Bike for ROM seat 12 rocking with flexion holds for Rt knee Standing:Rt knee flexion stretch 12" step 10X10"  Forward step up with bil UE assist 6" step 10X each  Lateral step up 4" step with bil UE assist 10X each Bodycraft set with 2 holes showing 7PL 2X10 with stretch into flexion  01/02/24 Seated NuStep Level 2, seat 10,  warm-up Leg press with 50#, then 70#, then 90# x10 each   Minimal assist to get into machine  Leg extension machine with 20# x10  Supine  LAQS with 5# 2x10   12/26/23 Right Knee flexion active range of motion 0-75 at start of session; 0-80 after Supine/ prone Manual therapy to right knee: instrument-assisted soft tissue mobilization/ soft tissue mobilization to knee muscles, joint mobilizations grade 2-3 for knee extension/flexion ROM, PROM of knee, mobilization with movement into knee flexion  Leg curls with 2.5# 2x10 Moist hot pack to right knee 5'     PATIENT EDUCATION:  Education details: HEP Person educated: Patient Education method: Explanation Education  comprehension: verbalized understanding  HOME EXERCISE PROGRAM: Access Code: [email or web address] URL: [email or web address] Date: 01/16/2024 Prepared by: Seymour Bars  Exercises - Supine Quad Set  - 1-2 x daily - 2 sets - 10 reps - Sitting Knee Extension with Resistance  - 1-2 x daily - 7 x weekly - 2 sets - 10 reps - Supine Heel Slide with Strap (Mirrored)  - 1-2 x daily - 5-7 x weekly - 2 sets - 10 reps - Sidelying Piriformis Stretch  - 1-2 x daily - 5-7 x weekly - 3 reps - 30 hold - Seated Hip Adduction Isometrics with Ball  - 1-2 x daily - 5-7 x weekly - 2 sets - 10 reps - 3 hold  Patient Education - Paediatric nurse with Cane ----------------------------------------------------------------------------------------------  ASSESSMENT:  CLINICAL IMPRESSION: Pt continues to be compliant with HEP and reports overall improvements in mobility.  Did have a "pop" today in lateral aspect that didn't hurt.  Explained to patient about scar tissue and how this could be some breaking up allowing more motion. Pt still with difficulty completing lateral steps with higher step due to pain.  Will continue to benefit form skilled therapy to progress towards goals .    IE: Patient is a 68 y.o. y.o. female who was seen today for physical therapy evaluation and treatment for right knee pain, weakness, difficulty walking. Patient presents to PT with the following objective impairments: Abnormal gait, decreased activity tolerance, decreased mobility, difficulty walking, decreased ROM, decreased strength, improper body mechanics, obesity, and pain. These impairments limit the patient in activities such as carrying, lifting, bending, standing, squatting, stairs, transfers, and locomotion level. These impairments also limit the patient in participation such as meal prep, cleaning, laundry, interpersonal relationship, driving, shopping, community activity, and yard work. The patient will benefit from PT to  address the limitations/impairments listed below to return to their prior level of function in the domains of activity and participation.    PERSONAL FACTORS:  obesity  are also affecting patient's functional outcome.   REHAB POTENTIAL: Good  CLINICAL DECISION MAKING: Stable/uncomplicated  EVALUATION COMPLEXITY: Low  --------------------------------------------------------------------------------------------- GOALS: Goals reviewed with patient? No  SHORT TERM GOALS: Target date: 12/19/2023   1. Patient will be independent with a basic stretching/strengthening HEP  Baseline:  Goal status: goal met 2. Patient will be able to demonstrate right knee flexion active range of motion to 0-85 degrees to facilitate ADL completion, lifting, squatting. Baseline:  Goal status: goal partially met   LONG TERM GOALS: Target date: 01/09/2024  --> 02/27/2024      Patient will score a >/= 35/80 on the LEFS    to demonstrate a Minimally Clinically Importance Difference (MCID) in ADL completion, home/community ambulation, and lifting/bending/squatting. Baseline:  Goal status: goal met  2.  Patient will complete the 5 times sit to stand:    within 16s to demonstrate an improvement in ADL completion, stair negotiation, household/community ambulation, and self-care Baseline:  Goal status: goal partially met  3.  Patient will be independent with a comprehensive strengthening HEP  Baseline:  Goal status: goal partially met  4. Patient will be able to demonstrate right knee flexion active range of motion to 0-100 degrees to facilitate ADL completion, lifting, squatting. Baseline:  Goal status: goal partially met  --------------------------------------------------------------------------------------------- PLAN:  PT FREQUENCY: 1x/week  PT DURATION: 6 weeks  PLANNED INTERVENTIONS: 97110-Therapeutic exercises, 97530- Therapeutic activity, 97112- Neuromuscular re-education, 97535- Self Care,  97140- Manual therapy, [phone]- Gait training, Patient/Family education, Balance training, Stair training, Taping, Dry Needling, Joint mobilization, Joint manipulation, Spinal manipulation, Spinal mobilization, DME instructions, Cryotherapy, and Moist heat.  PLAN FOR NEXT SESSION: Progress lower extremity active range of motion/strength  Increase stabilization exercises and update HEP next session.    Lurena Nida, PTA/CLT [facility] Ph: [phone]  01/23/2024, 2:02 PM

## 2024-01-28 ENCOUNTER — Encounter (HOSPITAL_COMMUNITY): Payer: Self-pay | Admitting: Occupational Therapy

## 2024-01-28 ENCOUNTER — Ambulatory Visit (HOSPITAL_COMMUNITY): Payer: HMO | Attending: Orthopedic Surgery | Admitting: Occupational Therapy

## 2024-01-28 ENCOUNTER — Ambulatory Visit (HOSPITAL_COMMUNITY): Payer: HMO | Admitting: Physical Therapy

## 2024-01-28 DIAGNOSIS — R29898 Other symptoms and signs involving the musculoskeletal system: Secondary | ICD-10-CM | POA: Insufficient documentation

## 2024-01-28 DIAGNOSIS — R262 Difficulty in walking, not elsewhere classified: Secondary | ICD-10-CM | POA: Insufficient documentation

## 2024-01-28 DIAGNOSIS — M25511 Pain in right shoulder: Secondary | ICD-10-CM | POA: Insufficient documentation

## 2024-01-28 DIAGNOSIS — G8929 Other chronic pain: Secondary | ICD-10-CM | POA: Diagnosis not present

## 2024-01-28 DIAGNOSIS — M25611 Stiffness of right shoulder, not elsewhere classified: Secondary | ICD-10-CM | POA: Diagnosis not present

## 2024-01-28 DIAGNOSIS — M25561 Pain in right knee: Secondary | ICD-10-CM | POA: Insufficient documentation

## 2024-01-28 DIAGNOSIS — M6281 Muscle weakness (generalized): Secondary | ICD-10-CM

## 2024-01-28 NOTE — Therapy (Signed)
 [facility] PH[facility] THERAPY TREATMENT    Patient Name: Karina Sawyer MRN: [id number] DOB:May 18, 1956, 68 y.o., female Today's Date: 01/28/2024  END OF SESSION:   PT End of Session - 01/28/24 1325     Visit Number 8    Number of Visits 12    Date for PT Re-Evaluation 02/27/24    Authorization Type [employer]    Authorization Time Period no auth    Progress Note Due on Visit 12    PT Start Time 1306    PT Stop Time 1345    PT Time Calculation (min) 39 min    Activity Tolerance Patient tolerated treatment well    Behavior During Therapy [facility] for tasks assessed/performed              Past Medical History:  Diagnosis Date   Abnormal results of liver function studies    Acute maxillary sinusitis    around 2003   Arthritis    knees   Chest pain    Chest pain 2022   Per pt, she experienced a burning sensation in her chest and an abnormal EKG dated 04/26/21. She was sent to Dr. Jeffrie, cardiologist on 04/26/21. Echocardiogram demonstrated LVEF  55 - 60% and severe left atrial dilation. Patient states that she was told everything was fine, and that her chest pain was likely acid reflux.   Heart murmur    many years ago told had a murmur but no one recenctly mentioned   History of kidney stones 2003   Hyperlipidemia    Hypertension    Follows w/ PCP, Dr. Darlyn Hurst takes HCTZ.   Hypothyroidism    Follows with PCP, Dr. Darlyn Hurst. Currently taking Synthroid.   Menopausal and female climacteric states    Morbid obesity (HCC)    PONV (postoperative nausea and vomiting)    Patient states severe nausea and vomiting after anesthesia.   Wears glasses    reading only   Past Surgical History:  Procedure Laterality Date   APPENDECTOMY  1971   COLONOSCOPY  2015   [facility] - no polyps per pt   DILATATION & CURETTAGE/HYSTEROSCOPY WITH MYOSURE N/A 12/05/2022   Procedure: DILATATION & CURETTAGE/HYSTEROSCOPY WITH MYOSURE LITE;  Surgeon: Diedre Rosaline BRAVO, MD;  Location:  [facility] [place];  Service: Gynecology;  Laterality: N/A;  Device needed = myosure lite   NECK SURGERY     growth removed at birth   ovary removed     age 60   TUBAL LIGATION  08/24/1989   early 54's   WISDOM TOOTH EXTRACTION     many years ago   There are no active problems to display for this patient.   PCP: Hurst Norleen PEDLAR, MDPCP - General   REFERRING PROVIDER: Beverley Evalene BIRCH, MDRef Provider   REFERRING DIAG: M17.11 (ICD-10-CM) - Osteoarthritis of right knee, unspecified osteoarthritis type   Rationale for Evaluation and Treatment: Rehabilitation  THERAPY DIAG:  No diagnosis found.  ONSET DATE: within 3 months --------------------------------------------------------------------------------------------- SUBJECTIVE:  SUBJECTIVE STATEMENT:  01/28/24:  PT reports she is feeling stronger and better overall    Progress Note 01/16/24:  Patient states she has seen improvement in her overall knee mobility and pain levels. Patient is still unable to do stairs or unable to stand long during ADLs.     IE: Within the last 3 months, patient's right knee pain has increased and has been popping. Cortisone shot to the right knee in October 2024. Patient has been referred to OPPT. (Daughter, Olam present).    PERTINENT HISTORY:  Morbid obesity  PAIN:  Are you having pain? No  PRECAUTIONS: None  RED FLAGS: None   WEIGHT BEARING RESTRICTIONS: No  FALLS:  Has patient fallen in last 6 months? No  LIVING ENVIRONMENT: Lives with: lives alone Lives in: House/apartment Stairs: Yes: External: 2 steps; none Has following equipment at home: Single point cane and Walker - 2 wheeled  OCCUPATION: retired  PLOF: Independent  PATIENT GOALS: Patient wants to avoid right knee surgery   NEXT MD  VISIT: TBD  --------------------------------------------------------------------------------------------- OBJECTIVE:   DIAGNOSTIC FINDINGS:  N/a  PATIENT SURVEYS:  LEFS 25/80  01/16/24 Lower Extremity Functional Scale (LEFS) 33/80  SCREENING FOR RED FLAGS: Bowel or bladder incontinence: No Spinal tumors: No Cauda equina syndrome: No Compression fracture: No Abdominal aneurysm: No  COGNITION: Overall cognitive status: Within functional limits for tasks assessed  POSTURE: No Significant postural limitations      FUNCTIONAL TESTS:  5 times sit to stand: 19.23s with upper extremity assist    01/16/24 5 times sit to stand: 18s with NO upper extremity assistance  2 MWT: 375 with moderate antlagia on right lower extremity; right trunk lateral shift    GAIT ANALYSIS: Distance walked: 19ft Assistive device utilized: None Level of assistance: Complete Independence Comments: Patient with moderate right lower extremity antalgia  SENSATION: WFL    LOWER EXTREMITY MMT:    MMT Right eval Left eval  Hip flexion 3/5   Hip extension    Hip abduction    Hip adduction    Hip internal rotation    Hip external rotation    Knee flexion 3-/5 4-/5  Knee extension 3+/5 4-/5  Ankle dorsiflexion    Ankle plantarflexion    Ankle inversion    Ankle eversion     (Blank rows = not tested)  LOWER EXTREMITY ROM:     Active  Right eval Left eval 01/16/24 Right   Hip flexion     Hip extension     Hip abduction     Hip adduction     Hip internal rotation     Hip external rotation     Knee flexion 0-80 0-90 0-82  Knee extension     Ankle dorsiflexion     Ankle plantarflexion     Ankle inversion     Ankle eversion      (Blank rows = not tested)  LOWER EXTREMITY SPECIAL TESTS Patellafemoral grind test: positive      PALPATION: Moderate tenderness to palpation right medial knee Moderate hypermobility patella with lateral tracking  INTEGUMENTARY    WFL --------------------------------------------------------------------------------------------- TODAY'S TREATMENT:  DATE: 01/28/24 Standing: Rt knee flexion stretch 12 10X10  Forward step ups bil UE assist 6 step 20X each  Lateral step ups bil UE assist 4 step 20X each  Lunges no UE assist 2X10 each  Tandem stance 30 each no UE Bodycraft set with 2 holes showing 7PL 2X10 with stretch into flexion Nustep seat 9 UE 7 portug[place] 6 minutes level 5  01/23/24 Nustep seat 9 UE 7 [place] 5 minutes Standing: Rt knee flexion stretch 12 10X10  Forward step ups bil UE assist 6 step 10X each  Lateral step ups bil UE assist 4 step 10X each Bodycraft set with 2 holes showing 7PL 2X10 with stretch into flexion  01/16/24 PT Progress Note Supine Manual therapy to right knee: mobilization with movement into flexion    01/09/24 Nustep seat 9 UE 7 level 4 [place] X5 minutes Bike for ROM seat 12 rocking with flexion holds for Rt knee Standing:Rt knee flexion stretch 12 step 10X10  Forward step up with bil UE assist 6 step 10X each  Lateral step up 4 step with bil UE assist 10X each Bodycraft set with 2 holes showing 7PL 2X10 with stretch into flexion  01/02/24 Seated NuStep Level 2, seat 10,  warm-up Leg press with 50#, then 70#, then 90# x10 each   Minimal assist to get into machine  Leg extension machine with 20# x10  Supine  LAQS with 5# 2x10    PATIENT EDUCATION:  Education details: HEP Person educated: Patient Education method: Explanation Education comprehension: verbalized understanding  HOME EXERCISE PROGRAM: Access Code: [email or web address] URL: [email or web address] Date: 01/16/2024 Prepared by: Deward Ming  Exercises - Supine Quad Set  - 1-2 x daily - 2 sets - 10 reps - Sitting Knee Extension with Resistance  - 1-2 x daily -  7 x weekly - 2 sets - 10 reps - Supine Heel Slide with Strap (Mirrored)  - 1-2 x daily - 5-7 x weekly - 2 sets - 10 reps - Sidelying Piriformis Stretch  - 1-2 x daily - 5-7 x weekly - 3 reps - 30 hold - Seated Hip Adduction Isometrics with Ball  - 1-2 x daily - 5-7 x weekly - 2 sets - 10 reps - 3 hold  Patient Education - Paediatric Nurse with Cane ----------------------------------------------------------------------------------------------  ASSESSMENT:  CLINICAL IMPRESSION: Focused session today on functional strengthening with added stability challenges today.  Began lunges without UE assist working on control and tandem stance without UE assist.  Lt LE leading more challenging but able to maintain full 30 seconds with either LE leading and no UE assist. Finished on nustep today prior to OT session.  Pt overall improving with decreasing pain.  No rest breaks needed or complaints of pain.     IE: Patient is a 68 y.o. y.o. female who was seen today for physical therapy evaluation and treatment for right knee pain, weakness, difficulty walking. Patient presents to PT with the following objective impairments: Abnormal gait, decreased activity tolerance, decreased mobility, difficulty walking, decreased ROM, decreased strength, improper body mechanics, obesity, and pain. These impairments limit the patient in activities such as carrying, lifting, bending, standing, squatting, stairs, transfers, and locomotion level. These impairments also limit the patient in participation such as meal prep, cleaning, laundry, interpersonal relationship, driving, shopping, community activity, and yard work. The patient will benefit from PT to address the limitations/impairments listed below to return to their prior level of function in the domains of activity and participation.  PERSONAL FACTORS:  obesity  are also affecting patient's functional outcome.   REHAB POTENTIAL: Good  CLINICAL DECISION MAKING:  Stable/uncomplicated  EVALUATION COMPLEXITY: Low  --------------------------------------------------------------------------------------------- GOALS: Goals reviewed with patient? No  SHORT TERM GOALS: Target date: 12/19/2023   1. Patient will be independent with a basic stretching/strengthening HEP  Baseline:  Goal status: goal met 2. Patient will be able to demonstrate right knee flexion active range of motion to 0-85 degrees to facilitate ADL completion, lifting, squatting. Baseline:  Goal status: goal partially met   LONG TERM GOALS: Target date: 01/09/2024  --> 02/27/2024      Patient will score a >/= 35/80 on the LEFS    to demonstrate a Minimally Clinically Importance Difference (MCID) in ADL completion, home/community ambulation, and lifting/bending/squatting. Baseline:  Goal status: goal met  2.  Patient will complete the 5 times sit to stand:    within 16s to demonstrate an improvement in ADL completion, stair negotiation, household/community ambulation, and self-care Baseline:  Goal status: goal partially met  3.  Patient will be independent with a comprehensive strengthening HEP  Baseline:  Goal status: goal partially met  4. Patient will be able to demonstrate right knee flexion active range of motion to 0-100 degrees to facilitate ADL completion, lifting, squatting. Baseline:  Goal status: goal partially met  --------------------------------------------------------------------------------------------- PLAN:  PT FREQUENCY: 1x/week  PT DURATION: 6 weeks  PLANNED INTERVENTIONS: 97110-Therapeutic exercises, 97530- Therapeutic activity, 97112- Neuromuscular re-education, 97535- Self Care, 02859- Manual therapy, [phone]- Gait training, Patient/Family education, Balance training, Stair training, Taping, Dry Needling, Joint mobilization, Joint manipulation, Spinal manipulation, Spinal mobilization, DME instructions, Cryotherapy, and Moist heat.  PLAN FOR NEXT  SESSION: Progress lower extremity active range of motion/strength.  Progress stability challenges.   Greig KATHEE Fuse, PTA/CLT [facility] Ph: [phone]  01/28/2024, 1:26 PM

## 2024-01-28 NOTE — Therapy (Signed)
 OUTPATIENT OCCUPATIONAL THERAPY ORTHO TREATMENT    Patient Name: Karina Sawyer MRN: [id number] DOB:[birth year], 68 y.o., female Today's Date: 01/28/2024   END OF SESSION:  OT End of Session - 01/28/24 1431     Visit Number 10    Number of Visits 13    Date for OT Re-Evaluation 02/28/24    Authorization Type [employer]    Progress Note Due on Visit 19    OT Start Time 1347    OT Stop Time 1425    OT Time Calculation (min) 38 min    Activity Tolerance Patient tolerated treatment well    Behavior During Therapy [facility] for tasks assessed/performed              Past Medical History:  Diagnosis Date   Abnormal results of liver function studies    Acute maxillary sinusitis    around 2003   Arthritis    knees   Chest pain    Chest pain 2022   Per pt, she experienced a burning sensation in her chest and an abnormal EKG dated 04/26/21. She was sent to Dr. Jeffrie, cardiologist on 04/26/21. Echocardiogram demonstrated LVEF  55 - 60% and severe left atrial dilation. Patient states that she was told everything was fine, and that her chest pain was likely acid reflux.   Heart murmur    many years ago told had a murmur but no one recenctly mentioned   History of kidney stones 2003   Hyperlipidemia    Hypertension    Follows w/ PCP, Dr. Darlyn Hurst takes HCTZ.   Hypothyroidism    Follows with PCP, Dr. Darlyn Hurst. Currently taking Synthroid.   Menopausal and female climacteric states    Morbid obesity (HCC)    PONV (postoperative nausea and vomiting)    Patient states severe nausea and vomiting after anesthesia.   Wears glasses    reading only   Past Surgical History:  Procedure Laterality Date   APPENDECTOMY  1971   COLONOSCOPY  2015   [facility] - no polyps per pt   DILATATION & CURETTAGE/HYSTEROSCOPY WITH MYOSURE N/A 12/05/2022   Procedure: DILATATION & CURETTAGE/HYSTEROSCOPY WITH MYOSURE LITE;  Surgeon: Diedre Rosaline BRAVO, MD;  Location: [facility];   Service: Gynecology;  Laterality: N/A;  Device needed = myosure lite   NECK SURGERY     growth removed at birth   ovary removed     age 56   TUBAL LIGATION  08/24/1989   early 77's   WISDOM TOOTH EXTRACTION     many years ago   There are no active problems to display for this patient.   PCP: Dr. Norleen Hurst REFERRING PROVIDER: Dr. Evalene Chancy  ONSET DATE: several months  REFERRING DIAG: M19.011 (ICD-10-CM) - Osteoarthritis of right shoulder, unspecified osteoarthritis type   THERAPY DIAG:  Stiffness of right shoulder, not elsewhere classified  Chronic right shoulder pain  Other symptoms and signs involving the musculoskeletal system  Rationale for Evaluation and Treatment: Rehabilitation  SUBJECTIVE:   SUBJECTIVE STATEMENT: S: It feels ok, it's still there.   PERTINENT HISTORY: Pt is a 68 y/o female presenting with chronic right shoulder pain. Pt has received several injections, 2 in the last month one of which was US  guided, with some relief. Pt would like to try conservative treatment before opting for TSA.   PRECAUTIONS: None  WEIGHT BEARING RESTRICTIONS: No  PAIN:  Are you having pain? No  FALLS: Has patient fallen in last 6  months? No  PLOF: Independent  PATIENT GOALS: To be able to move my arm more.   NEXT MD VISIT: None  OBJECTIVE:   HAND DOMINANCE: Right  ADLs: Overall ADLs: I can't hardly brush my teeth or brush my hair. My hand shakes when holding a cup of water. Pt has difficulty dressing and undressing, has to have help unhooking bra. Pt is unable to reach overhead or behind back.   FUNCTIONAL OUTCOME MEASURES: FOTO: 45/100 12/26/23: 56/100 01/23/24: 58.82/100  UPPER EXTREMITY ROM:       Assessed in sitting, er/IR adducted  Active ROM Right eval Right 12/26/23 Right 01/23/24  Shoulder flexion 94 115 133  Shoulder abduction 103 123 123  Shoulder internal rotation 90 90 90  Shoulder external rotation 18 25 37  (Blank rows = not  tested)  Assessed in supine, er/IR adducted  Passive ROM Right eval Right 12/26/23  Shoulder flexion 110 132  Shoulder abduction 108 127  Shoulder internal rotation 90 90  Shoulder external rotation 20 20  (Blank rows = not tested)    UPPER EXTREMITY MMT:     Assessed in sitting, er/IR adducted  MMT Right eval Right 12/26/23 Right 01/23/24  Shoulder flexion 3-/5 4/5 4+/5  Shoulder abduction 3-/5 3+/5 4/5  Shoulder internal rotation 3/5 5/5 5/5  Shoulder external rotation 3-/5 4-/5 4/5  (Blank rows = not tested)  HAND FUNCTION: Grip strength: Right: 48 lbs; Left: 45 lbs  OBSERVATIONS: mod fascial restrictions along right upper arm, trapezius, and scapular regions; degenerative creptius noted with mobility   TODAY'S TREATMENT:                                                                                                                              DATE:  01/28/24 -Myofascial release to right upper arm, anterior shoulder, trapezius, and scapular regions to decrease pain and fascial restrictions and increase joint ROM -P/ROM: supine-flexion, abduction, er, 5 reps  -Shoulder strengthening: 1#, supine-protraction, er/IR, horizontal abduction, 10 reps -A/ROM: supine-flexion, abduction, 10 reps -Proximal shoulder strengthening: supine-paddles, criss cross, circles each direction, 10 reps each -Functional reaching: pt reaching into flexion to place 10 cones on middle shelf of overhead cabinet, removing in abduction -Ball pass: behind back for IR, 10 reps -Overhead lacing: seated-lacing from top down then reversing -Scapular A/ROM: row, extension, retraction, 10 reps  01/23/24 -Myofascial release to right upper arm, anterior shoulder, trapezius, and scapular regions to decrease pain and fascial restrictions and increase joint ROM -A/ROM: seated, flexion, abduction, protraction, horizontal abduction, er/IR, x15 -Proximal shoulder strengthening: supine-paddles, criss cross, circles  each direction -Scapular A/ROM: row, extension, 10 reps -Measurements for reassessment  01/16/24 -Myofascial release to right upper arm, anterior shoulder, trapezius, and scapular regions to decrease pain and fascial restrictions and increase joint ROM -P/ROM: supine-flexion, abduction, er, 5 reps  -Shoulder strengthening: 1#, supine-protraction, flexion, er/IR, abduction, 10 reps -A/ROM: supine-horizontal abduction, abduction, 10 reps -Proximal shoulder strengthening: supine-paddles, criss cross, circles  each direction -Scapular A/ROM: row, extension, 10 reps -A/ROM: standing-protraction, flexion, abduction, er, horizontal abduction, 10 reps -Scapular theraband: red-row, extension, retraction, 10 reps    PATIENT EDUCATION: Education details: reviewed HEP Person educated: Patient and Child(ren) Education method: Explanation, Demonstration, and Handouts Education comprehension: verbalized understanding and returned demonstration  HOME EXERCISE PROGRAM: Elyn: AA/ROM 12/26/23: A/ROM 01/02/24: Isometrics 01/16/24: red scapular theraband  GOALS: Goals reviewed with patient? Yes   SHORT TERM GOALS: Target date: 12/28/23  Pt will be provided with and educated on HEP to improve mobility in RUE required for use during ADL completion.   Goal status: IN PROGRESS  2.  Pt will increase RUE A/ROM by 50 degrees to improve ability to use RUE during dressing tasks with minimal compensatory techniques.   Goal status: REVISED  3.  Pt will increase RUE strength to 4+/5 to improve ability to reach for items at waist to chest height during dressing, bathing, and grooming tasks.   Goal status: REVISED  4.  Pt will decrease pain in RUE to 3/10 or less to improve ability to perform grooming and bathing tasks with minimal discomfort.   Goal status: MET  5.  Pt will decrease RUE fascial restrictions to min amounts or less to improve mobility required for functional reaching tasks.   Goal status: IN  PROGRESS   ASSESSMENT:  CLINICAL IMPRESSION: Pt reports no pain in her RUE today, she was able to pull a tarp of leaves 2x this weekend. Continued with manual techniques for soft tissue mobilization, progressing to strengthening of the RUE. Pt using 1# weights for some tasks, unable to use for all tasks due to muscle fatigue and weakness. Pt did great with functional reaching tasks, able to reach top of overhead lacing line today. Verbal cuing for form and technique. Pt is very pleased with shoulder progress and is improving functional use of the RUE at home.    PERFORMANCE DEFICITS: in functional skills including in functional skills including ADLs, IADLs, coordination, tone, ROM, strength, pain, fascial restrictions, muscle spasms, and UE functional use   PLAN:  OT FREQUENCY: 1x/week  OT DURATION: 4 weeks  PLANNED INTERVENTIONS: 97168 OT Re-evaluation, 97535 self care/ADL training, 02889 therapeutic exercise, 97530 therapeutic activity, 97140 manual therapy, 97014 electrical stimulation unattended, patient/family education, and DME and/or AE instructions  CONSULTED AND AGREED WITH PLAN OF CARE: Patient  PLAN FOR NEXT SESSION: Follow up on HEP, manual techniques, A/ROM, functional reaching    Sonny Cory, OTR/L  [phone] 01/28/2024, 2:33 PM

## 2024-02-04 ENCOUNTER — Encounter (HOSPITAL_COMMUNITY): Payer: Self-pay | Admitting: Occupational Therapy

## 2024-02-04 ENCOUNTER — Ambulatory Visit (HOSPITAL_COMMUNITY): Payer: HMO | Admitting: Occupational Therapy

## 2024-02-04 DIAGNOSIS — M25611 Stiffness of right shoulder, not elsewhere classified: Secondary | ICD-10-CM | POA: Diagnosis not present

## 2024-02-04 DIAGNOSIS — G8929 Other chronic pain: Secondary | ICD-10-CM

## 2024-02-04 DIAGNOSIS — R29898 Other symptoms and signs involving the musculoskeletal system: Secondary | ICD-10-CM

## 2024-02-04 NOTE — Therapy (Signed)
OUTPATIENT OCCUPATIONAL THERAPY ORTHO TREATMENT    Patient Name: Karina Sawyer MRN: [id number] DOB:19-Apr-1956, 68 y.o., female Today's Date: 02/04/2024   END OF SESSION:  OT End of Session - 02/04/24 1456     Visit Number 11    Number of Visits 13    Date for OT Re-Evaluation 02/28/24    Authorization Type [employer]    Progress Note Due on Visit 19    OT Start Time 1350    OT Stop Time 1430    OT Time Calculation (min) 40 min    Activity Tolerance Patient tolerated treatment well    Behavior During Therapy [facility] for tasks assessed/performed             Past Medical History:  Diagnosis Date   Abnormal results of liver function studies    Acute maxillary sinusitis    around 2003   Arthritis    knees   Chest pain    Chest pain 2022   Per pt, she experienced a burning sensation in her chest and an abnormal EKG dated 04/26/21. She was sent to Dr. Anne Fu, cardiologist on 04/26/21. Echocardiogram demonstrated LVEF  55 - 60% and severe left atrial dilation. Patient states that she was told everything was fine, and that her chest pain was likely acid reflux.   Heart murmur    many years ago told had a murmur but no one recenctly mentioned   History of kidney stones 2003   Hyperlipidemia    Hypertension    Follows w/ PCP, Dr. Catalina Pizza takes HCTZ.   Hypothyroidism    Follows with PCP, Dr. Catalina Pizza. Currently taking Synthroid.   Menopausal and female climacteric states    Morbid obesity (HCC)    PONV (postoperative nausea and vomiting)    Patient states severe nausea and vomiting after anesthesia.   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   PCP: Dr. Nita Sells REFERRING PROVIDER: Dr. Margarita Rana  ONSET DATE: several months  REFERRING DIAG: M19.011 (ICD-10-CM) - Osteoarthritis of right shoulder, unspecified osteoarthritis type   THERAPY DIAG:  Stiffness of right shoulder, not elsewhere classified  Chronic right shoulder pain  Other symptoms and signs involving the musculoskeletal system  Rationale for Evaluation and Treatment: Rehabilitation  SUBJECTIVE:   SUBJECTIVE STATEMENT: S: "I've had to do so much the past few days, I think I've warn in out."   PERTINENT HISTORY: Pt is a 68 y/o female presenting with chronic right shoulder pain. Pt has received several injections, 2 in the last month one of which was US guided, with some relief. Pt would like to try conservative treatment before opting for TSA.   PRECAUTIONS: None  WEIGHT BEARING RESTRICTIONS: No  PAIN:  Are you having pain?  No  FALLS: Has patient fallen in last 6 months? No  PLOF: Independent  PATIENT GOALS: To be able to move my arm more.   NEXT MD VISIT: None  OBJECTIVE:   HAND DOMINANCE: Right  ADLs: Overall ADLs: I can't hardly brush my teeth or brush my hair. My hand shakes when holding a cup of water. Pt has difficulty dressing and undressing, has to have help unhooking bra. Pt is unable to reach overhead or behind back.   FUNCTIONAL OUTCOME MEASURES: FOTO: 45/100 12/26/23: 56/100 01/23/24: 58.82/100  UPPER EXTREMITY ROM:       Assessed in sitting, er/IR adducted  Active ROM Right eval Right 12/26/23 Right 01/23/24  Shoulder flexion 94 115 133  Shoulder abduction 103 123 123  Shoulder internal rotation 90 90 90  Shoulder external rotation  18 25 37  (Blank rows = not tested)  Assessed in supine, er/IR adducted  Passive ROM Right eval Right 12/26/23  Shoulder flexion 110 132  Shoulder abduction 108 127  Shoulder internal rotation 90 90  Shoulder external rotation 20 20  (Blank rows = not tested)    UPPER EXTREMITY MMT:     Assessed in sitting, er/IR adducted  MMT Right eval Right 12/26/23 Right 01/23/24  Shoulder flexion 3-/5 4/5 4+/5  Shoulder abduction 3-/5 3+/5 4/5  Shoulder internal rotation 3/5 5/5 5/5  Shoulder external rotation 3-/5 4-/5 4/5  (Blank rows = not tested)  HAND FUNCTION: Grip strength: Right: 48 lbs; Left: 45 lbs  OBSERVATIONS: mod fascial restrictions along right upper arm, trapezius, and scapular regions; degenerative creptius noted with mobility   TODAY'S TREATMENT:                                                                                                                              DATE:   02/04/24 -Myofascial release to right upper arm, anterior shoulder, trapezius, and scapular regions to decrease pain and fascial restrictions and increase joint ROM -P/ROM: supine-flexion, abduction, er, 5 reps  -A/ROM: seated, flexion, abduction, protraction, horizontal abduction, er/IR, x10 -wall slides: flexion, abduction, x10  01/28/24 -Myofascial release to right upper arm, anterior shoulder, trapezius, and scapular regions to decrease pain and fascial restrictions and increase joint ROM -P/ROM: supine-flexion, abduction, er, 5 reps  -Shoulder strengthening: 1#, supine-protraction, er/IR, horizontal abduction, 10 reps -A/ROM: supine-flexion, abduction, 10 reps -Proximal shoulder strengthening: supine-paddles, criss cross, circles each direction, 10 reps each -Functional reaching: pt reaching into flexion to place 10 cones on middle shelf of overhead cabinet, removing in abduction -Ball pass: behind back for IR, 10 reps -Overhead lacing: seated-lacing from top down then  reversing -Scapular A/ROM: row, extension, retraction, 10 reps  01/23/24 -Myofascial release to right upper arm, anterior shoulder, trapezius, and scapular regions to decrease pain and fascial restrictions and increase joint ROM -A/ROM: seated, flexion, abduction, protraction, horizontal abduction, er/IR, x15 -Proximal shoulder strengthening: supine-paddles, criss cross, circles each direction -Scapular A/ROM: row, extension, 10 reps -Measurements  for reassessment  01/16/24 -Myofascial release to right upper arm, anterior shoulder, trapezius, and scapular regions to decrease pain and fascial restrictions and increase joint ROM -P/ROM: supine-flexion, abduction, er, 5 reps  -Shoulder strengthening: 1#, supine-protraction, flexion, er/IR, abduction, 10 reps -A/ROM: supine-horizontal abduction, abduction, 10 reps -Proximal shoulder strengthening: supine-paddles, criss cross, circles each direction -Scapular A/ROM: row, extension, 10 reps -A/ROM: standing-protraction, flexion, abduction, er, horizontal abduction, 10 reps -Scapular theraband: red-row, extension, retraction, 10 reps    PATIENT EDUCATION: Education details: Heat and massage to reduce pain/stiffness Person educated: Patient and Child(ren) Education method: Explanation, Demonstration, and Handouts Education comprehension: verbalized understanding and returned demonstration  HOME EXERCISE PROGRAM: Eva: AA/ROM 12/26/23: A/ROM 01/02/24: Isometrics 01/16/24: red scapular theraband  GOALS: Goals reviewed with patient? Yes   SHORT TERM GOALS: Target date: 12/28/23  Pt will be provided with and educated on HEP to improve mobility in RUE required for use during ADL completion.   Goal status: IN PROGRESS  2.  Pt will increase RUE A/ROM by 50 degrees to improve ability to use RUE during dressing tasks with minimal compensatory techniques.   Goal status: REVISED  3.  Pt will increase RUE strength to 4+/5 to improve ability to  reach for items at waist to chest height during dressing, bathing, and grooming tasks.   Goal status: REVISED  4.  Pt will decrease pain in RUE to 3/10 or less to improve ability to perform grooming and bathing tasks with minimal discomfort.   Goal status: MET  5.  Pt will decrease RUE fascial restrictions to min amounts or less to improve mobility required for functional reaching tasks.   Goal status: IN PROGRESS   ASSESSMENT:  CLINICAL IMPRESSION: Pt presents to this session with increased stiffness/tightness along the entire shoulder girdle and biceps/deltoid, limiting her ROM. She required multiple breaks due to increase pain/discomfort this session. Session focused only on stretching and ROM due to patient tolerance, with OT providing education to try heat, muscle cream, and massage at home. Verbal and tactile cuing provided for positioning and technique.    PERFORMANCE DEFICITS: in functional skills including in functional skills including ADLs, IADLs, coordination, tone, ROM, strength, pain, fascial restrictions, muscle spasms, and UE functional use   PLAN:  OT FREQUENCY: 1x/week  OT DURATION: 4 weeks  PLANNED INTERVENTIONS: 97168 OT Re-evaluation, 97535 self care/ADL training, 44010 therapeutic exercise, 97530 therapeutic activity, 97140 manual therapy, 97014 electrical stimulation unattended, patient/family education, and DME and/or AE instructions  CONSULTED AND AGREED WITH PLAN OF CARE: Patient  PLAN FOR NEXT SESSION: Follow up on HEP, manual techniques, A/ROM, functional reaching    Trish Mage, OTR/L [phone] 02/04/2024, 2:57 PM

## 2024-02-06 ENCOUNTER — Ambulatory Visit (HOSPITAL_COMMUNITY): Payer: HMO | Admitting: Physical Therapy

## 2024-02-06 DIAGNOSIS — M6281 Muscle weakness (generalized): Secondary | ICD-10-CM

## 2024-02-06 DIAGNOSIS — M25611 Stiffness of right shoulder, not elsewhere classified: Secondary | ICD-10-CM | POA: Diagnosis not present

## 2024-02-06 DIAGNOSIS — R262 Difficulty in walking, not elsewhere classified: Secondary | ICD-10-CM

## 2024-02-06 DIAGNOSIS — M25561 Pain in right knee: Secondary | ICD-10-CM

## 2024-02-06 NOTE — Therapy (Signed)
[facility] OUTPATIENT PHYSICAL THERAPY TREATMENT    Patient Name: Karina Sawyer MRN: [id number] DOB:1956-11-08, 68 y.o., female Today's Date: 02/06/2024  END OF SESSION:   PT End of Session - 02/06/24 1436     Visit Number 9    Number of Visits 12    Date for PT Re-Evaluation 02/27/24    Authorization Type [employer]    Authorization Time Period no auth    Progress Note Due on Visit 12    PT Start Time 1435    PT Stop Time 1515    PT Time Calculation (min) 40 min    Activity Tolerance Patient tolerated treatment well    Behavior During Therapy [facility] for tasks assessed/performed              Past Medical History:  Diagnosis Date   Abnormal results of liver function studies    Acute maxillary sinusitis    around 2003   Arthritis    knees   Chest pain    Chest pain 2022   Per pt, she experienced a burning sensation in her chest and an abnormal EKG dated 04/26/21. She was sent to Dr. Anne Fu, cardiologist on 04/26/21. Echocardiogram demonstrated LVEF  55 - 60% and severe left atrial dilation. Patient states that she was told everything was fine, and that her chest pain was likely acid reflux.   Heart murmur    many years ago told had a murmur but no one recenctly mentioned   History of kidney stones 2003   Hyperlipidemia    Hypertension    Follows w/ PCP, Dr. Catalina Pizza takes HCTZ.   Hypothyroidism    Follows with PCP, Dr. Catalina Pizza. Currently taking Synthroid.   Menopausal and female climacteric states    Morbid obesity (HCC)    PONV (postoperative nausea and vomiting)    Patient states severe nausea and vomiting after anesthesia.   Wears glasses    reading only   Past Surgical History:  Procedure Laterality Date   APPENDECTOMY  1971   COLONOSCOPY  2015   [facility] - no polyps per pt   DILATATION & CURETTAGE/HYSTEROSCOPY WITH MYOSURE N/A 12/05/2022   Procedure: DILATATION & CURETTAGE/HYSTEROSCOPY WITH MYOSURE LITE;  Surgeon: Charlett Nose, MD;  Location:  [facility];  Service: Gynecology;  Laterality: N/A;  Device needed = myosure lite   NECK SURGERY     growth removed at birth   ovary removed     age 34   TUBAL LIGATION  08/24/1989   early 70's   WISDOM TOOTH EXTRACTION     many years ago   There are no active problems to display for this patient.   PCP: Benita Stabile, MDPCP - General   REFERRING PROVIDER: Sheral Apley, MDRef Provider   REFERRING DIAG: M17.11 (ICD-10-CM) - Osteoarthritis of right knee, unspecified osteoarthritis type   Rationale for Evaluation and Treatment: Rehabilitation  THERAPY DIAG:  Right knee pain, unspecified chronicity  Muscle weakness (generalized)  Difficulty in walking, not elsewhere classified  ONSET DATE: within 3 months --------------------------------------------------------------------------------------------- SUBJECTIVE:  SUBJECTIVE STATEMENT:  02/06/24:  PT reports she was a little sore after helping her daughter move some clothes.  No pain or issues today.     Progress Note 01/16/24:  Patient states she has seen improvement in her overall knee mobility and pain levels. Patient is still unable to do stairs or unable to stand long during ADLs.     IE: Within the last 3 months, patient's right knee pain has increased and has been popping. Cortisone shot to the right knee in October 2024. Patient has been referred to OPPT. (Daughter, Misty Stanley present).    PERTINENT HISTORY:  Morbid obesity  PAIN:  Are you having pain? No  PRECAUTIONS: None  RED FLAGS: None   WEIGHT BEARING RESTRICTIONS: No  FALLS:  Has patient fallen in last 6 months? No  LIVING ENVIRONMENT: Lives with: lives alone Lives in: House/apartment Stairs: Yes: External: 2 steps; none Has following equipment at home:  Single point cane and Walker - 2 wheeled  OCCUPATION: retired  PLOF: Independent  PATIENT GOALS: Patient wants to avoid right knee surgery   NEXT MD VISIT: TBD  --------------------------------------------------------------------------------------------- OBJECTIVE:   DIAGNOSTIC FINDINGS:  N/a  PATIENT SURVEYS:  LEFS 25/80  01/16/24 Lower Extremity Functional Scale (LEFS) 33/80  SCREENING FOR RED FLAGS: Bowel or bladder incontinence: No Spinal tumors: No Cauda equina syndrome: No Compression fracture: No Abdominal aneurysm: No  COGNITION: Overall cognitive status: Within functional limits for tasks assessed  POSTURE: No Significant postural limitations      FUNCTIONAL TESTS:  5 times sit to stand: 19.23s with upper extremity assist    01/16/24 5 times sit to stand: 18s with NO upper extremity assistance  2 MWT: 375 with moderate antlagia on right lower extremity; right trunk lateral shift    GAIT ANALYSIS: Distance walked: 64ft Assistive device utilized: None Level of assistance: Complete Independence Comments: Patient with moderate right lower extremity antalgia  SENSATION: WFL    LOWER EXTREMITY MMT:    MMT Right eval Left eval  Hip flexion 3/5   Hip extension    Hip abduction    Hip adduction    Hip internal rotation    Hip external rotation    Knee flexion 3-/5 4-/5  Knee extension 3+/5 4-/5  Ankle dorsiflexion    Ankle plantarflexion    Ankle inversion    Ankle eversion     (Blank rows = not tested)  LOWER EXTREMITY ROM:     Active  Right eval Left eval 01/16/24 Right   Hip flexion     Hip extension     Hip abduction     Hip adduction     Hip internal rotation     Hip external rotation     Knee flexion 0-80 0-90 0-82  Knee extension     Ankle dorsiflexion     Ankle plantarflexion     Ankle inversion     Ankle eversion      (Blank rows = not tested)  LOWER EXTREMITY SPECIAL TESTS Patellafemoral grind test: positive       PALPATION: Moderate tenderness to palpation right medial knee Moderate hypermobility patella with lateral tracking  INTEGUMENTARY   WFL --------------------------------------------------------------------------------------------- TODAY'S TREATMENT:  DATE: 02/06/24 Standing: Rt knee flexion stretch 12" 10X10"  Forward step ups bil UE assist 6" step 20X each  Lateral step ups bil UE assist 4" step 20X each  Lunges no UE assist 2X10 each Bodycraft set with 2 holes showing 7PL 2X10 with stretch into flexion Nustep seat 9 UE 7 [place] 6 minutes level 5  01/28/24 Standing: Rt knee flexion stretch 12" 10X10"  Forward step ups bil UE assist 6" step 20X each  Lateral step ups bil UE assist 4" step 20X each  Lunges no UE assist 2X10 each  Tandem stance 30" each no UE Bodycraft set with 2 holes showing 7PL 2X10 with stretch into flexion Nustep seat 9 UE 7 [place] 6 minutes level 5  01/23/24 Nustep seat 9 UE 7 [place] 5 minutes Standing: Rt knee flexion stretch 12" 10X10"  Forward step ups bil UE assist 6" step 10X each  Lateral step ups bil UE assist 4" step 10X each Bodycraft set with 2 holes showing 7PL 2X10 with stretch into flexion  01/16/24 PT Progress Note Supine Manual therapy to right knee: mobilization with movement into flexion    01/09/24 Nustep seat 9 UE 7 level 4 [place] X5 minutes Bike for ROM seat 12 rocking with flexion holds for Rt knee Standing:Rt knee flexion stretch 12" step 10X10"  Forward step up with bil UE assist 6" step 10X each  Lateral step up 4" step with bil UE assist 10X each Bodycraft set with 2 holes showing 7PL 2X10 with stretch into flexion  01/02/24 Seated NuStep Level 2, seat 10,  warm-up Leg press with 50#, then 70#, then 90# x10 each   Minimal assist to get into machine  Leg extension machine with 20# x10   Supine  LAQS with 5# 2x10    PATIENT EDUCATION:  Education details: HEP Person educated: Patient Education method: Explanation Education comprehension: verbalized understanding  HOME EXERCISE PROGRAM: Access Code: [email or web address] URL: [email or web address] Date: 01/16/2024 Prepared by: Seymour Bars  Exercises - Supine Quad Set  - 1-2 x daily - 2 sets - 10 reps - Sitting Knee Extension with Resistance  - 1-2 x daily - 7 x weekly - 2 sets - 10 reps - Supine Heel Slide with Strap (Mirrored)  - 1-2 x daily - 5-7 x weekly - 2 sets - 10 reps - Sidelying Piriformis Stretch  - 1-2 x daily - 5-7 x weekly - 3 reps - 30 hold - Seated Hip Adduction Isometrics with Ball  - 1-2 x daily - 5-7 x weekly - 2 sets - 10 reps - 3 hold  Patient Education - Paediatric nurse with Cane ----------------------------------------------------------------------------------------------  ASSESSMENT:  CLINICAL IMPRESSION: Focused session today on functional strengthening. Improved control with lunges and no C/O pain or discomfort throughout session.  Minimal cues and no rest breaks needed during session today.  Pt overall improving.  IE: Patient is a 68 y.o. y.o. female who was seen today for physical therapy evaluation and treatment for right knee pain, weakness, difficulty walking. Patient presents to PT with the following objective impairments: Abnormal gait, decreased activity tolerance, decreased mobility, difficulty walking, decreased ROM, decreased strength, improper body mechanics, obesity, and pain. These impairments limit the patient in activities such as carrying, lifting, bending, standing, squatting, stairs, transfers, and locomotion level. These impairments also limit the patient in participation such as meal prep, cleaning, laundry, interpersonal relationship, driving, shopping, community activity, and yard work. The patient will benefit from PT to address the limitations/impairments  listed  below to return to their prior level of function in the domains of activity and participation.    PERSONAL FACTORS:  obesity  are also affecting patient's functional outcome.   REHAB POTENTIAL: Good  CLINICAL DECISION MAKING: Stable/uncomplicated  EVALUATION COMPLEXITY: Low  --------------------------------------------------------------------------------------------- GOALS: Goals reviewed with patient? No  SHORT TERM GOALS: Target date: 12/19/2023   1. Patient will be independent with a basic stretching/strengthening HEP  Baseline:  Goal status: goal met 2. Patient will be able to demonstrate right knee flexion active range of motion to 0-85 degrees to facilitate ADL completion, lifting, squatting. Baseline:  Goal status: goal partially met   LONG TERM GOALS: Target date: 01/09/2024  --> 02/27/2024      Patient will score a >/= 35/80 on the LEFS    to demonstrate a Minimally Clinically Importance Difference (MCID) in ADL completion, home/community ambulation, and lifting/bending/squatting. Baseline:  Goal status: goal met  2.  Patient will complete the 5 times sit to stand:    within 16s to demonstrate an improvement in ADL completion, stair negotiation, household/community ambulation, and self-care Baseline:  Goal status: goal partially met  3.  Patient will be independent with a comprehensive strengthening HEP  Baseline:  Goal status: goal partially met  4. Patient will be able to demonstrate right knee flexion active range of motion to 0-100 degrees to facilitate ADL completion, lifting, squatting. Baseline:  Goal status: goal partially met  --------------------------------------------------------------------------------------------- PLAN:  PT FREQUENCY: 1x/week  PT DURATION: 6 weeks  PLANNED INTERVENTIONS: 97110-Therapeutic exercises, 97530- Therapeutic activity, 97112- Neuromuscular re-education, 97535- Self Care, 40981- Manual therapy, [phone]- Gait training,  Patient/Family education, Balance training, Stair training, Taping, Dry Needling, Joint mobilization, Joint manipulation, Spinal manipulation, Spinal mobilization, DME instructions, Cryotherapy, and Moist heat.  PLAN FOR NEXT SESSION: Progress lower extremity active range of motion/strength.  Progress stability challenges. Re-evaluate X 2 more sessions.  Lurena Nida, PTA/CLT [facility] Ph: [phone]  02/06/2024, 3:53 PM

## 2024-02-11 ENCOUNTER — Encounter (HOSPITAL_COMMUNITY): Payer: Self-pay | Admitting: Occupational Therapy

## 2024-02-11 ENCOUNTER — Ambulatory Visit (HOSPITAL_COMMUNITY): Payer: HMO

## 2024-02-11 ENCOUNTER — Encounter (HOSPITAL_COMMUNITY): Payer: Self-pay

## 2024-02-11 ENCOUNTER — Ambulatory Visit (HOSPITAL_COMMUNITY): Payer: HMO | Admitting: Occupational Therapy

## 2024-02-11 DIAGNOSIS — M25611 Stiffness of right shoulder, not elsewhere classified: Secondary | ICD-10-CM

## 2024-02-11 DIAGNOSIS — G8929 Other chronic pain: Secondary | ICD-10-CM

## 2024-02-11 DIAGNOSIS — R262 Difficulty in walking, not elsewhere classified: Secondary | ICD-10-CM

## 2024-02-11 DIAGNOSIS — R29898 Other symptoms and signs involving the musculoskeletal system: Secondary | ICD-10-CM

## 2024-02-11 DIAGNOSIS — M25561 Pain in right knee: Secondary | ICD-10-CM

## 2024-02-11 DIAGNOSIS — M6281 Muscle weakness (generalized): Secondary | ICD-10-CM

## 2024-02-11 NOTE — Therapy (Unsigned)
OUTPATIENT OCCUPATIONAL THERAPY ORTHO TREATMENT    Patient Name: Karina Sawyer MRN: [id number] DOB:[birth year], 68 y.o., female Today's Date: 02/12/2024   END OF SESSION:  OT End of Session - 02/11/24 1520     Visit Number 12    Number of Visits 13    Date for OT Re-Evaluation 02/28/24    Authorization Type [employer]    Progress Note Due on Visit 19    OT Start Time 1440    OT Stop Time 1520    OT Time Calculation (min) 40 min    Activity Tolerance Patient tolerated treatment well    Behavior During Therapy [facility] for tasks assessed/performed              Past Medical History:  Diagnosis Date   Abnormal results of liver function studies    Acute maxillary sinusitis    around 2003   Arthritis    knees   Chest pain    Chest pain 2022   Per pt, she experienced a burning sensation in her chest and an abnormal EKG dated 04/26/21. She was sent to Dr. Anne Fu, cardiologist on 04/26/21. Echocardiogram demonstrated LVEF  55 - 60% and severe left atrial dilation. Patient states that she was told everything was fine, and that her chest pain was likely acid reflux.   Heart murmur    many years ago told had a murmur but no one recenctly mentioned   History of kidney stones 2003   Hyperlipidemia    Hypertension    Follows w/ PCP, Dr. Catalina Pizza takes HCTZ.   Hypothyroidism    Follows with PCP, Dr. Catalina Pizza. Currently taking Synthroid.   Menopausal and female climacteric states    Morbid obesity (HCC)    PONV (postoperative nausea and vomiting)    Patient states severe nausea and vomiting after anesthesia.   Wears glasses    reading only   Past Surgical History:  Procedure Laterality Date   APPENDECTOMY  1971   COLONOSCOPY  2015   [facility] - no polyps per pt   DILATATION & CURETTAGE/HYSTEROSCOPY WITH MYOSURE N/A 12/05/2022   Procedure: DILATATION & CURETTAGE/HYSTEROSCOPY WITH MYOSURE LITE;  Surgeon: Charlett Nose, MD;  Location: [facility];   Service: Gynecology;  Laterality: N/A;  Device needed = myosure lite   NECK SURGERY     growth removed at birth   ovary removed     age 35   TUBAL LIGATION  08/24/1989   early 76's   WISDOM TOOTH EXTRACTION     many years ago   There are no active problems to display for this patient.   PCP: Dr. Nita Sells REFERRING PROVIDER: Dr. Margarita Rana  ONSET DATE: several months  REFERRING DIAG: M19.011 (ICD-10-CM) - Osteoarthritis of right shoulder, unspecified osteoarthritis type   THERAPY DIAG:  Stiffness of right shoulder, not elsewhere classified  Chronic right shoulder pain  Other symptoms and signs involving the musculoskeletal system  Rationale for Evaluation and Treatment: Rehabilitation  SUBJECTIVE:   SUBJECTIVE STATEMENT: S: "I've had to do so much the past few days, I think I've warn in out."   PERTINENT HISTORY: Pt is a 68 y/o female presenting with chronic right shoulder pain. Pt has received several injections, 2 in the last month one of which was US guided, with some relief. Pt would like to try conservative treatment before opting for TSA.   PRECAUTIONS: None  WEIGHT BEARING RESTRICTIONS: No  PAIN:  Are you having  pain? No  FALLS: Has patient fallen in last 6 months? No  PLOF: Independent  PATIENT GOALS: To be able to move my arm more.   NEXT MD VISIT: None  OBJECTIVE:   HAND DOMINANCE: Right  ADLs: Overall ADLs: I can't hardly brush my teeth or brush my hair. My hand shakes when holding a cup of water. Pt has difficulty dressing and undressing, has to have help unhooking bra. Pt is unable to reach overhead or behind back.   FUNCTIONAL OUTCOME MEASURES: FOTO: 45/100 12/26/23: 56/100 01/23/24: 58.82/100  UPPER EXTREMITY ROM:       Assessed in sitting, er/IR adducted  Active ROM Right eval Right 12/26/23 Right 01/23/24  Shoulder flexion 94 115 133  Shoulder abduction 103 123 123  Shoulder internal rotation 90 90 90  Shoulder external  rotation 18 25 37  (Blank rows = not tested)  Assessed in supine, er/IR adducted  Passive ROM Right eval Right 12/26/23  Shoulder flexion 110 132  Shoulder abduction 108 127  Shoulder internal rotation 90 90  Shoulder external rotation 20 20  (Blank rows = not tested)    UPPER EXTREMITY MMT:     Assessed in sitting, er/IR adducted  MMT Right eval Right 12/26/23 Right 01/23/24  Shoulder flexion 3-/5 4/5 4+/5  Shoulder abduction 3-/5 3+/5 4/5  Shoulder internal rotation 3/5 5/5 5/5  Shoulder external rotation 3-/5 4-/5 4/5  (Blank rows = not tested)  HAND FUNCTION: Grip strength: Right: 48 lbs; Left: 45 lbs  OBSERVATIONS: mod fascial restrictions along right upper arm, trapezius, and scapular regions; degenerative creptius noted with mobility   TODAY'S TREATMENT:                                                                                                                              DATE:   02/11/24 -Myofascial release to right upper arm, anterior shoulder, trapezius, and scapular regions to decrease pain and fascial restrictions and increase joint ROM -P/ROM: supine-flexion, abduction, er, 5 reps -A/ROM: seated, flexion, abduction, protraction, horizontal abduction, er/IR, x12 -Proximal shoulder strengthening: seated-paddles, criss cross, circles each direction, 10 reps each -Scapular Strengthening: red band, extension, retraction, rows, x10 -UBE: Level 1, 2.5' forwards and backwards  02/04/24 -Myofascial release to right upper arm, anterior shoulder, trapezius, and scapular regions to decrease pain and fascial restrictions and increase joint ROM -P/ROM: supine-flexion, abduction, er, 5 reps  -A/ROM: seated, flexion, abduction, protraction, horizontal abduction, er/IR, x10 -wall slides: flexion, abduction, x10  01/28/24 -Myofascial release to right upper arm, anterior shoulder, trapezius, and scapular regions to decrease pain and fascial restrictions and increase joint  ROM -P/ROM: supine-flexion, abduction, er, 5 reps  -Shoulder strengthening: 1#, supine-protraction, er/IR, horizontal abduction, 10 reps -A/ROM: supine-flexion, abduction, 10 reps -Proximal shoulder strengthening: supine-paddles, criss cross, circles each direction, 10 reps each -Functional reaching: pt reaching into flexion to place 10 cones on middle shelf of overhead cabinet, removing in abduction -Ball pass: behind back for  IR, 10 reps -Overhead lacing: seated-lacing from top down then reversing -Scapular A/ROM: row, extension, retraction, 10 reps    PATIENT EDUCATION: Education details: Publishing rights manager Person educated: Patient and Child(ren) Education method: Explanation, Demonstration, and Handouts Education comprehension: verbalized understanding and returned demonstration  HOME EXERCISE PROGRAM: Carley Hammed: AA/ROM 12/26/23: A/ROM 01/02/24: Isometrics 01/16/24: red scapular theraband  GOALS: Goals reviewed with patient? Yes   SHORT TERM GOALS: Target date: 12/28/23  Pt will be provided with and educated on HEP to improve mobility in RUE required for use during ADL completion.   Goal status: IN PROGRESS  2.  Pt will increase RUE A/ROM by 50 degrees to improve ability to use RUE during dressing tasks with minimal compensatory techniques.   Goal status: REVISED  3.  Pt will increase RUE strength to 4+/5 to improve ability to reach for items at waist to chest height during dressing, bathing, and grooming tasks.   Goal status: REVISED  4.  Pt will decrease pain in RUE to 3/10 or less to improve ability to perform grooming and bathing tasks with minimal discomfort.   Goal status: MET  5.  Pt will decrease RUE fascial restrictions to min amounts or less to improve mobility required for functional reaching tasks.   Goal status: IN PROGRESS   ASSESSMENT:  CLINICAL IMPRESSION: This session, pt presents with improved ROM from previous session, as well as improved pain  tolerance. She did report mild tightening sensation along her biceps and trapezius with A/ROM. PT was able to tolerate starting scapular strengthening back this session with good form, as well as UBE, which helped her to loosen up. Verbal and visual cuing provided intermittently throughout session for positioning and technique.    PERFORMANCE DEFICITS: in functional skills including in functional skills including ADLs, IADLs, coordination, tone, ROM, strength, pain, fascial restrictions, muscle spasms, and UE functional use   PLAN:  OT FREQUENCY: 1x/week  OT DURATION: 4 weeks  PLANNED INTERVENTIONS: 97168 OT Re-evaluation, 97535 self care/ADL training, 78295 therapeutic exercise, 97530 therapeutic activity, 97140 manual therapy, 97014 electrical stimulation unattended, patient/family education, and DME and/or AE instructions  CONSULTED AND AGREED WITH PLAN OF CARE: Patient  PLAN FOR NEXT SESSION: Follow up on HEP, manual techniques, A/ROM, functional reaching    Trish Mage, OTR/L [phone] 02/12/2024, 8:57 AM

## 2024-02-11 NOTE — Patient Instructions (Signed)

## 2024-02-11 NOTE — Therapy (Signed)
[facility] OUTPATIENT PHYSICAL THERAPY TREATMENT    Patient Name: Karina Sawyer MRN: [id number] DOB:07/07/1956, 68 y.o., female Today's Date: 02/11/2024  END OF SESSION:   PT End of Session - 02/11/24 1518     Visit Number 10    Number of Visits 12    Authorization Type [employer]    Authorization Time Period no auth    Progress Note Due on Visit 12   Progress note visit #6   PT Start Time 1520    PT Stop Time 1600    PT Time Calculation (min) 40 min    Activity Tolerance Patient tolerated treatment well    Behavior During Therapy [facility] for tasks assessed/performed              Past Medical History:  Diagnosis Date   Abnormal results of liver function studies    Acute maxillary sinusitis    around 2003   Arthritis    knees   Chest pain    Chest pain 2022   Per pt, she experienced a burning sensation in her chest and an abnormal EKG dated 04/26/21. She was sent to Dr. Anne Fu, cardiologist on 04/26/21. Echocardiogram demonstrated LVEF  55 - 60% and severe left atrial dilation. Patient states that she was told everything was fine, and that her chest pain was likely acid reflux.   Heart murmur    many years ago told had a murmur but no one recenctly mentioned   History of kidney stones 2003   Hyperlipidemia    Hypertension    Follows w/ PCP, Dr. Catalina Pizza takes HCTZ.   Hypothyroidism    Follows with PCP, Dr. Catalina Pizza. Currently taking Synthroid.   Menopausal and female climacteric states    Morbid obesity (HCC)    PONV (postoperative nausea and vomiting)    Patient states severe nausea and vomiting after anesthesia.   Wears glasses    reading only   Past Surgical History:  Procedure Laterality Date   APPENDECTOMY  1971   COLONOSCOPY  2015   [facility] - no polyps per pt   DILATATION & CURETTAGE/HYSTEROSCOPY WITH MYOSURE N/A 12/05/2022   Procedure: DILATATION & CURETTAGE/HYSTEROSCOPY WITH MYOSURE LITE;  Surgeon: Charlett Nose, MD;  Location: [facility];  Service: Gynecology;  Laterality: N/A;  Device needed = myosure lite   NECK SURGERY     growth removed at birth   ovary removed     age 30   TUBAL LIGATION  08/24/1989   early 51's   WISDOM TOOTH EXTRACTION     many years ago   There are no active problems to display for this patient.   PCP: Benita Stabile, MDPCP - General   REFERRING PROVIDER: Sheral Apley, MDRef Provider   REFERRING DIAG: M17.11 (ICD-10-CM) - Osteoarthritis of right knee, unspecified osteoarthritis type   Rationale for Evaluation and Treatment: Rehabilitation  THERAPY DIAG:  Right knee pain, unspecified chronicity  Muscle weakness (generalized)  Difficulty in walking, not elsewhere classified  ONSET DATE: within 3 months --------------------------------------------------------------------------------------------- SUBJECTIVE:  SUBJECTIVE STATEMENT: 02/11/24:  Pt stated her knee can feel the cold weather coming.  No pain currently.  Progress Note 01/16/24:  Patient states she has seen improvement in her overall knee mobility and pain levels. Patient is still unable to do stairs or unable to stand long during ADLs.     IE: Within the last 3 months, patient's right knee pain has increased and has been popping. Cortisone shot to the right knee in October 2024. Patient has been referred to OPPT. (Daughter, Misty Stanley present).    PERTINENT HISTORY:  Morbid obesity  PAIN:  Are you having pain? No  PRECAUTIONS: None  RED FLAGS: None   WEIGHT BEARING RESTRICTIONS: No  FALLS:  Has patient fallen in last 6 months? No  LIVING ENVIRONMENT: Lives with: lives alone Lives in: House/apartment Stairs: Yes: External: 2 steps; none Has following equipment at home: Single point cane and Walker - 2  wheeled  OCCUPATION: retired  PLOF: Independent  PATIENT GOALS: Patient wants to avoid right knee surgery   NEXT MD VISIT: TBD  --------------------------------------------------------------------------------------------- OBJECTIVE:   DIAGNOSTIC FINDINGS:  N/a  PATIENT SURVEYS:  LEFS 25/80  01/16/24 Lower Extremity Functional Scale (LEFS) 33/80  SCREENING FOR RED FLAGS: Bowel or bladder incontinence: No Spinal tumors: No Cauda equina syndrome: No Compression fracture: No Abdominal aneurysm: No  COGNITION: Overall cognitive status: Within functional limits for tasks assessed  POSTURE: No Significant postural limitations      FUNCTIONAL TESTS:  5 times sit to stand: 19.23s with upper extremity assist    01/16/24 5 times sit to stand: 18s with NO upper extremity assistance  2 MWT: 375 with moderate antlagia on right lower extremity; right trunk lateral shift    GAIT ANALYSIS: Distance walked: 52ft Assistive device utilized: None Level of assistance: Complete Independence Comments: Patient with moderate right lower extremity antalgia  SENSATION: WFL    LOWER EXTREMITY MMT:    MMT Right eval Left eval  Hip flexion 3/5   Hip extension    Hip abduction    Hip adduction    Hip internal rotation    Hip external rotation    Knee flexion 3-/5 4-/5  Knee extension 3+/5 4-/5  Ankle dorsiflexion    Ankle plantarflexion    Ankle inversion    Ankle eversion     (Blank rows = not tested)  LOWER EXTREMITY ROM:     Active  Right eval Left eval 01/16/24 Right   Hip flexion     Hip extension     Hip abduction     Hip adduction     Hip internal rotation     Hip external rotation     Knee flexion 0-80 0-90 0-82  Knee extension     Ankle dorsiflexion     Ankle plantarflexion     Ankle inversion     Ankle eversion      (Blank rows = not tested)  LOWER EXTREMITY SPECIAL TESTS Patellafemoral grind test: positive      PALPATION: Moderate tenderness to  palpation right medial knee Moderate hypermobility patella with lateral tracking  INTEGUMENTARY   WFL --------------------------------------------------------------------------------------------- TODAY'S TREATMENT:  DATE: 02/11/24; Knee drive on 81XB step 5x 10" Forward step up Bil UE A with 6in- reduced to 4in with decreased UE A 20x each Lateral step up Bil UE A with 4in step 2x 10 each Squat front of chair SLS Lt 10", Rt 10" Sidestep with GTB around thigh 3RT inside // bars Tandem stance 1x 30" on foam 1x 30" Vector stance 2x 5" with HHA Nustep seat 9 UE 11 [place] 5 minutes level 5 AROM 85 degrees flexion   02/06/24 Standing: Rt knee flexion stretch 12" 10X10"  Forward step ups bil UE assist 6" step 20X each  Lateral step ups bil UE assist 4" step 20X each  Lunges no UE assist 2X10 each Bodycraft set with 2 holes showing 7PL 2X10 with stretch into flexion Nustep seat 9 UE 7 [place] 6 minutes level 5  01/28/24 Standing: Rt knee flexion stretch 12" 10X10"  Forward step ups bil UE assist 6" step 20X each  Lateral step ups bil UE assist 4" step 20X each  Lunges no UE assist 2X10 each  Tandem stance 30" each no UE Bodycraft set with 2 holes showing 7PL 2X10 with stretch into flexion Nustep seat 9 UE 7 [place] 6 minutes level 5  01/23/24 Nustep seat 9 UE 7 [place] 5 minutes Standing: Rt knee flexion stretch 12" 10X10"  Forward step ups bil UE assist 6" step 10X each  Lateral step ups bil UE assist 4" step 10X each Bodycraft set with 2 holes showing 7PL 2X10 with stretch into flexion  01/16/24 PT Progress Note Supine Manual therapy to right knee: mobilization with movement into flexion    01/09/24 Nustep seat 9 UE 7 level 4 [place] X5 minutes Bike for ROM seat 12 rocking with flexion holds for Rt knee Standing:Rt knee flexion stretch 12"  step 10X10"  Forward step up with bil UE assist 6" step 10X each  Lateral step up 4" step with bil UE assist 10X each Bodycraft set with 2 holes showing 7PL 2X10 with stretch into flexion  01/02/24 Seated NuStep Level 2, seat 10,  warm-up Leg press with 50#, then 70#, then 90# x10 each   Minimal assist to get into machine  Leg extension machine with 20# x10  Supine  LAQS with 5# 2x10    PATIENT EDUCATION:  Education details: HEP Person educated: Patient Education method: Explanation Education comprehension: verbalized understanding  HOME EXERCISE PROGRAM: Access Code: [email or web address] URL: [email or web address] Date: 01/16/2024 Prepared by: Seymour Bars  Exercises - Supine Quad Set  - 1-2 x daily - 2 sets - 10 reps - Sitting Knee Extension with Resistance  - 1-2 x daily - 7 x weekly - 2 sets - 10 reps - Supine Heel Slide with Strap (Mirrored)  - 1-2 x daily - 5-7 x weekly - 2 sets - 10 reps - Sidelying Piriformis Stretch  - 1-2 x daily - 5-7 x weekly - 3 reps - 30 hold - Seated Hip Adduction Isometrics with Ball  - 1-2 x daily - 5-7 x weekly - 2 sets - 10 reps - 3 hold  Patient Education - Reciprocal Gait with Cane  02/11/24:  - Single Leg Stance  - 2 x daily - 7 x weekly - 1 sets - 5 reps - 30" hold - Standing Tandem Balance with Counter Support  - 2 x daily - 7 x weekly - 1 sets - 3 reps - 30" hold - Side Stepping with Resistance at Thighs and Counter  Support  - 1 x daily - 7 x weekly - 1 sets - 5 reps ----------------------------------------------------------------------------------------------  ASSESSMENT:  CLINICAL IMPRESSION: Session focus with functional strengthening and additional stability training.  Added balance/gluteal strengthening exercises with good tolerance though does report some pain with SLS based activities, monitored through session that was resolved following exercise.  Added balance associated exercises to HEP with printout given.  No  reports of increased pain at EOS.    IE: Patient is a 68 y.o. y.o. female who was seen today for physical therapy evaluation and treatment for right knee pain, weakness, difficulty walking. Patient presents to PT with the following objective impairments: Abnormal gait, decreased activity tolerance, decreased mobility, difficulty walking, decreased ROM, decreased strength, improper body mechanics, obesity, and pain. These impairments limit the patient in activities such as carrying, lifting, bending, standing, squatting, stairs, transfers, and locomotion level. These impairments also limit the patient in participation such as meal prep, cleaning, laundry, interpersonal relationship, driving, shopping, community activity, and yard work. The patient will benefit from PT to address the limitations/impairments listed below to return to their prior level of function in the domains of activity and participation.    PERSONAL FACTORS:  obesity  are also affecting patient's functional outcome.   REHAB POTENTIAL: Good  CLINICAL DECISION MAKING: Stable/uncomplicated  EVALUATION COMPLEXITY: Low  --------------------------------------------------------------------------------------------- GOALS: Goals reviewed with patient? No  SHORT TERM GOALS: Target date: 12/19/2023   1. Patient will be independent with a basic stretching/strengthening HEP  Baseline:  Goal status: goal met 2. Patient will be able to demonstrate right knee flexion active range of motion to 0-85 degrees to facilitate ADL completion, lifting, squatting. Baseline:  Goal status: goal partially met   LONG TERM GOALS: Target date: 01/09/2024  --> 02/27/2024      Patient will score a >/= 35/80 on the LEFS    to demonstrate a Minimally Clinically Importance Difference (MCID) in ADL completion, home/community ambulation, and lifting/bending/squatting. Baseline:  Goal status: goal met  2.  Patient will complete the 5 times sit to stand:     within 16s to demonstrate an improvement in ADL completion, stair negotiation, household/community ambulation, and self-care Baseline:  Goal status: goal partially met  3.  Patient will be independent with a comprehensive strengthening HEP  Baseline:  Goal status: goal partially met  4. Patient will be able to demonstrate right knee flexion active range of motion to 0-100 degrees to facilitate ADL completion, lifting, squatting. Baseline:  Goal status: goal partially met  --------------------------------------------------------------------------------------------- PLAN:  PT FREQUENCY: 1x/week  PT DURATION: 6 weeks  PLANNED INTERVENTIONS: 97110-Therapeutic exercises, 97530- Therapeutic activity, 97112- Neuromuscular re-education, 97535- Self Care, 14782- Manual therapy, [phone]- Gait training, Patient/Family education, Balance training, Stair training, Taping, Dry Needling, Joint mobilization, Joint manipulation, Spinal manipulation, Spinal mobilization, DME instructions, Cryotherapy, and Moist heat.  PLAN FOR NEXT SESSION: Progress lower extremity active range of motion/strength.  Progress stability challenges. Re-evaluate X 2 more sessions.  Becky Sax, LPTA/CLT; [place] [phone]   02/11/2024, 4:11 PM

## 2024-02-18 ENCOUNTER — Ambulatory Visit (HOSPITAL_COMMUNITY): Payer: HMO

## 2024-02-18 ENCOUNTER — Encounter (HOSPITAL_COMMUNITY): Payer: Self-pay | Admitting: Occupational Therapy

## 2024-02-18 ENCOUNTER — Encounter (HOSPITAL_COMMUNITY): Payer: Self-pay

## 2024-02-18 ENCOUNTER — Ambulatory Visit (HOSPITAL_COMMUNITY): Payer: HMO | Admitting: Occupational Therapy

## 2024-02-18 DIAGNOSIS — R29898 Other symptoms and signs involving the musculoskeletal system: Secondary | ICD-10-CM

## 2024-02-18 DIAGNOSIS — M25611 Stiffness of right shoulder, not elsewhere classified: Secondary | ICD-10-CM

## 2024-02-18 DIAGNOSIS — M6281 Muscle weakness (generalized): Secondary | ICD-10-CM

## 2024-02-18 DIAGNOSIS — G8929 Other chronic pain: Secondary | ICD-10-CM

## 2024-02-18 DIAGNOSIS — M25561 Pain in right knee: Secondary | ICD-10-CM

## 2024-02-18 NOTE — Therapy (Unsigned)
 OUTPATIENT OCCUPATIONAL THERAPY ORTHO TREATMENT REASSESSMENT/RECERTIFICATION    Patient Name: Karina Sawyer MRN: [id number] DOB:[birth year], 68 y.o., female Today's Date: 02/19/2024   END OF SESSION:  OT End of Session - 02/18/24 1435     Visit Number 13    Number of Visits 17    Date for OT Re-Evaluation 02/28/24    Authorization Type [employer]    Progress Note Due on Visit 19    OT Start Time 1349    OT Stop Time 1435    OT Time Calculation (min) 46 min    Activity Tolerance Patient tolerated treatment well    Behavior During Therapy [facility] for tasks assessed/performed             Past Medical History:  Diagnosis Date   Abnormal results of liver function studies    Acute maxillary sinusitis    around 2003   Arthritis    knees   Chest pain    Chest pain 2022   Per pt, she experienced a burning sensation in her chest and an abnormal EKG dated 04/26/21. She was sent to Dr. Anne Fu, cardiologist on 04/26/21. Echocardiogram demonstrated LVEF  55 - 60% and severe left atrial dilation. Patient states that she was told everything was fine, and that her chest pain was likely acid reflux.   Heart murmur    many years ago told had a murmur but no one recenctly mentioned   History of kidney stones 2003   Hyperlipidemia    Hypertension    Follows w/ PCP, Dr. Catalina Pizza takes HCTZ.   Hypothyroidism    Follows with PCP, Dr. Catalina Pizza. Currently taking Synthroid.   Menopausal and female climacteric states    Morbid obesity (HCC)    PONV (postoperative nausea and vomiting)    Patient states severe nausea and vomiting after anesthesia.   Wears glasses    reading only   Past Surgical History:  Procedure Laterality Date   APPENDECTOMY  1971   COLONOSCOPY  2015   [facility] - no polyps per pt   DILATATION & CURETTAGE/HYSTEROSCOPY WITH MYOSURE N/A 12/05/2022   Procedure: DILATATION & CURETTAGE/HYSTEROSCOPY WITH MYOSURE LITE;  Surgeon: Charlett Nose, MD;  Location:  [facility];  Service: Gynecology;  Laterality: N/A;  Device needed = myosure lite   NECK SURGERY     growth removed at birth   ovary removed     age 14   TUBAL LIGATION  08/24/1989   early 49's   WISDOM TOOTH EXTRACTION     many years ago   There are no active problems to display for this patient.   PCP: Dr. Nita Sells REFERRING PROVIDER: Dr. Margarita Rana  ONSET DATE: several months  REFERRING DIAG: M19.011 (ICD-10-CM) - Osteoarthritis of right shoulder, unspecified osteoarthritis type   THERAPY DIAG:  Stiffness of right shoulder, not elsewhere classified  Chronic right shoulder pain  Other symptoms and signs involving the musculoskeletal system  Rationale for Evaluation and Treatment: Rehabilitation  SUBJECTIVE:   SUBJECTIVE STATEMENT: S: "It just feels so weak"   PERTINENT HISTORY: Pt is a 68 y/o female presenting with chronic right shoulder pain. Pt has received several injections, 2 in the last month one of which was US guided, with some relief. Pt would like to try conservative treatment before opting for TSA.   PRECAUTIONS: None  WEIGHT BEARING RESTRICTIONS: No  PAIN:  Are you having pain? No  FALLS: Has patient fallen in last 6 months?  No  PLOF: Independent  PATIENT GOALS: To be able to move my arm more.   NEXT MD VISIT: None  OBJECTIVE:   HAND DOMINANCE: Right  ADLs: Overall ADLs: I can't hardly brush my teeth or brush my hair. My hand shakes when holding a cup of water. Pt has difficulty dressing and undressing, has to have help unhooking bra. Pt is unable to reach overhead or behind back.   FUNCTIONAL OUTCOME MEASURES: FOTO: 45/100 12/26/23: 56/100 01/23/24: 58.82/100 02/18/24: 54.45/100  UPPER EXTREMITY ROM:       Assessed in sitting, er/IR adducted  Active ROM Right eval Right 12/26/23 Right 01/23/24 Right 02/18/24  Shoulder flexion 94 115 133 139  Shoulder abduction 103 123 123 123  Shoulder internal rotation 90 90 90 90   Shoulder external rotation 18 25 37 42  (Blank rows = not tested)  Assessed in supine, er/IR adducted  Passive ROM Right eval Right 12/26/23  Shoulder flexion 110 132  Shoulder abduction 108 127  Shoulder internal rotation 90 90  Shoulder external rotation 20 20  (Blank rows = not tested)    UPPER EXTREMITY MMT:     Assessed in sitting, er/IR adducted  MMT Right eval Right 12/26/23 Right 01/23/24 Right 02/18/24  Shoulder flexion 3-/5 4/5 4+/5 5/5  Shoulder abduction 3-/5 3+/5 4/5 5/5  Shoulder internal rotation 3/5 5/5 5/5 5/5  Shoulder external rotation 3-/5 4-/5 4/5 4/5  (Blank rows = not tested)  HAND FUNCTION: Grip strength: Right: 48 lbs; Left: 45 lbs  OBSERVATIONS: mod fascial restrictions along right upper arm, trapezius, and scapular regions; degenerative creptius noted with mobility   TODAY'S TREATMENT:                                                                                                                              DATE:   02/18/24 -Pulleys: flexion, abduction, x60" -A/ROM: seated, flexion, abduction, protraction, horizontal abduction, er/IR, x12 -Shoulder stretches: corner stretch, er stretch, 2x10" -PNF Strengthening: red band, chest pull, head height pulls, er pulls, PNF up, PNF down, x10 -Measurements for reassessment  02/11/24 -Myofascial release to right upper arm, anterior shoulder, trapezius, and scapular regions to decrease pain and fascial restrictions and increase joint ROM -P/ROM: supine-flexion, abduction, er, 5 reps -A/ROM: seated, flexion, abduction, protraction, horizontal abduction, er/IR, x12 -Proximal shoulder strengthening: seated-paddles, criss cross, circles each direction, 10 reps each -Scapular Strengthening: red band, extension, retraction, rows, x10 -UBE: Level 1, 2.5' forwards and backwards  02/04/24 -Myofascial release to right upper arm, anterior shoulder, trapezius, and scapular regions to decrease pain and fascial  restrictions and increase joint ROM -P/ROM: supine-flexion, abduction, er, 5 reps  -A/ROM: seated, flexion, abduction, protraction, horizontal abduction, er/IR, x10 -wall slides: flexion, abduction, x10    PATIENT EDUCATION: Education details: PNF Strengthening Person educated: Patient and Child(ren) Education method: Explanation, Demonstration, and Handouts Education comprehension: verbalized understanding and returned demonstration  HOME EXERCISE PROGRAM: Carley Hammed: AA/ROM 12/26/23: A/ROM 01/02/24: Isometrics  01/16/24: red scapular theraband 02/18/24: PNF Strengthening  GOALS: Goals reviewed with patient? Yes   SHORT TERM GOALS: Target date: 12/28/23  Pt will be provided with and educated on HEP to improve mobility in RUE required for use during ADL completion.   Goal status: IN PROGRESS  2.  Pt will increase RUE A/ROM by 50 degrees to improve ability to use RUE during dressing tasks with minimal compensatory techniques.   Goal status: REVISED  3.  Pt will increase RUE strength to 4+/5 to improve ability to reach for items at waist to chest height during dressing, bathing, and grooming tasks.   Goal status: REVISED  4.  Pt will decrease pain in RUE to 3/10 or less to improve ability to perform grooming and bathing tasks with minimal discomfort.   Goal status: MET  5.  Pt will decrease RUE fascial restrictions to min amounts or less to improve mobility required for functional reaching tasks.   Goal status: IN PROGRESS   ASSESSMENT:  CLINICAL IMPRESSION: This session, pt completed reassessment for recertification. She is demonstrating incremental ROM improvement and good improvement with overall strength. She continues to feel functionally weak during ADL's and IADL's, therefore OT recommending continuing skilled services with focus on strength improvement. PNF strengthening was added this session, which pt was able to complete with mild discomfort. Verbal and tactile cuing  provided for positioning and technique throughout session.    PERFORMANCE DEFICITS: in functional skills including in functional skills including ADLs, IADLs, coordination, tone, ROM, strength, pain, fascial restrictions, muscle spasms, and UE functional use   PLAN:  OT FREQUENCY: 1x/week  OT DURATION: 4 weeks  PLANNED INTERVENTIONS: 97168 OT Re-evaluation, 97535 self care/ADL training, 16109 therapeutic exercise, 97530 therapeutic activity, 97140 manual therapy, 97014 electrical stimulation unattended, patient/family education, and DME and/or AE instructions  CONSULTED AND AGREED WITH PLAN OF CARE: Patient  PLAN FOR NEXT SESSION: Follow up on HEP, manual techniques, A/ROM, functional reaching    Trish Mage, OTR/L [phone] 02/19/2024, 9:17 AM

## 2024-02-18 NOTE — Therapy (Signed)
 [facility] OUTPATIENT PHYSICAL THERAPY TREATMENT    Patient Name: Karina Sawyer MRN: [id number] DOB:1956/04/01, 68 y.o., female Today's Date: 02/18/2024  END OF SESSION:   PT End of Session - 02/18/24 1522     Visit Number 11    Number of Visits 15    Date for PT Re-Evaluation 03/17/24    Authorization Type [employer]    Authorization Time Period no auth    Progress Note Due on Visit 15    PT Start Time 1433    PT Stop Time 1515    PT Time Calculation (min) 42 min    Activity Tolerance Patient tolerated treatment well    Behavior During Therapy [facility] for tasks assessed/performed               Past Medical History:  Diagnosis Date   Abnormal results of liver function studies    Acute maxillary sinusitis    around 2003   Arthritis    knees   Chest pain    Chest pain 2022   Per pt, she experienced a burning sensation in her chest and an abnormal EKG dated 04/26/21. She was sent to Dr. Anne Fu, cardiologist on 04/26/21. Echocardiogram demonstrated LVEF  55 - 60% and severe left atrial dilation. Patient states that she was told everything was fine, and that her chest pain was likely acid reflux.   Heart murmur    many years ago told had a murmur but no one recenctly mentioned   History of kidney stones 2003   Hyperlipidemia    Hypertension    Follows w/ PCP, Dr. Catalina Pizza takes HCTZ.   Hypothyroidism    Follows with PCP, Dr. Catalina Pizza. Currently taking Synthroid.   Menopausal and female climacteric states    Morbid obesity (HCC)    PONV (postoperative nausea and vomiting)    Patient states severe nausea and vomiting after anesthesia.   Wears glasses    reading only   Past Surgical History:  Procedure Laterality Date   APPENDECTOMY  1971   COLONOSCOPY  2015   [facility] - no polyps per pt   DILATATION & CURETTAGE/HYSTEROSCOPY WITH MYOSURE N/A 12/05/2022   Procedure: DILATATION & CURETTAGE/HYSTEROSCOPY WITH MYOSURE LITE;  Surgeon: Charlett Nose, MD;   Location: [facility];  Service: Gynecology;  Laterality: N/A;  Device needed = myosure lite   NECK SURGERY     growth removed at birth   ovary removed     age 69   TUBAL LIGATION  08/24/1989   early 34's   WISDOM TOOTH EXTRACTION     many years ago   There are no active problems to display for this patient.   PCP: Benita Stabile, MDPCP - General   REFERRING PROVIDER: Sheral Apley, MDRef Provider   REFERRING DIAG: M17.11 (ICD-10-CM) - Osteoarthritis of right knee, unspecified osteoarthritis type   Rationale for Evaluation and Treatment: Rehabilitation  THERAPY DIAG:  Right knee pain, unspecified chronicity  Muscle weakness (generalized)  ONSET DATE: within 3 months --------------------------------------------------------------------------------------------- SUBJECTIVE:  SUBJECTIVE STATEMENT: 02/18/2024: Pt handed off with OT. Reports extending her OT appointments by four more weeks. No pain currently in the knee.   Progress Note 01/16/24:  Patient states she has seen improvement in her overall knee mobility and pain levels. Patient is still unable to do stairs or unable to stand long during ADLs.     IE: Within the last 3 months, patient's right knee pain has increased and has been popping. Cortisone shot to the right knee in October 2024. Patient has been referred to OPPT. (Daughter, Misty Stanley present).    PERTINENT HISTORY:  Morbid obesity  PAIN:  Are you having pain? No  PRECAUTIONS: None  RED FLAGS: None   WEIGHT BEARING RESTRICTIONS: No  FALLS:  Has patient fallen in last 6 months? No  LIVING ENVIRONMENT: Lives with: lives alone Lives in: House/apartment Stairs: Yes: External: 2 steps; none Has following equipment at home: Single point cane and Walker - 2  wheeled  OCCUPATION: retired  PLOF: Independent  PATIENT GOALS: Patient wants to avoid right knee surgery   NEXT MD VISIT: TBD  --------------------------------------------------------------------------------------------- OBJECTIVE:   DIAGNOSTIC FINDINGS:  N/a  PATIENT SURVEYS:  LEFS 25/80  01/16/24 Lower Extremity Functional Scale (LEFS) 33/80  SCREENING FOR RED FLAGS: Bowel or bladder incontinence: No Spinal tumors: No Cauda equina syndrome: No Compression fracture: No Abdominal aneurysm: No  COGNITION: Overall cognitive status: Within functional limits for tasks assessed  POSTURE: No Significant postural limitations      FUNCTIONAL TESTS:  5 times sit to stand: 19.23s with upper extremity assist    01/16/24 5 times sit to stand: 18s with NO upper extremity assistance  2 MWT: 375 with moderate antlagia on right lower extremity; right trunk lateral shift    GAIT ANALYSIS: Distance walked: 13ft Assistive device utilized: None Level of assistance: Complete Independence Comments: Patient with moderate right lower extremity antalgia  SENSATION: [facility] Fall    LOWER EXTREMITY MMT:    MMT Right eval Left eval R 02/18/2024 L 02/18/2024  Hip flexion 3/5     Hip extension      Hip abduction      Hip adduction      Hip internal rotation      Hip external rotation      Knee flexion 3-/5 4-/5    Knee extension 3+/5 4-/5 4-/5 4/5  Ankle dorsiflexion      Ankle plantarflexion      Ankle inversion      Ankle eversion       (Blank rows = not tested)  LOWER EXTREMITY ROM:     Active  Right eval Left eval 01/16/24 Right  R 02/18/2024 L 02/18/2024  Hip flexion       Hip extension       Hip abduction       Hip adduction       Hip internal rotation       Hip external rotation       Knee flexion 0-80 0-90 0-82 0-74   Knee extension       Ankle dorsiflexion       Ankle plantarflexion       Ankle inversion       Ankle eversion        (Blank rows = not  tested)  LOWER EXTREMITY SPECIAL TESTS Patellafemoral grind test: positive      PALPATION: Moderate tenderness to palpation right medial knee Moderate hypermobility patella with lateral tracking  INTEGUMENTARY   WFL --------------------------------------------------------------------------------------------- TODAY'S TREATMENT:  DATE: 02/18/2024  -LEFS: 35/80 -5x Sit/stand: 14 seconds -ROM -MMT -backwards walking with sled pull 84ft x 4 -Standing toe raises against wall x 20  02/11/24; Knee drive on 13YQ step 5x 10" Forward step up Bil UE A with 6in- reduced to 4in with decreased UE A 20x each Lateral step up Bil UE A with 4in step 2x 10 each Squat front of chair SLS Lt 10", Rt 10" Sidestep with GTB around thigh 3RT inside // bars Tandem stance 1x 30" on foam 1x 30" Vector stance 2x 5" with HHA Nustep seat 9 UE 11 [place] 5 minutes level 5 AROM 85 degrees flexion   02/06/24 Standing: Rt knee flexion stretch 12" 10X10"  Forward step ups bil UE assist 6" step 20X each  Lateral step ups bil UE assist 4" step 20X each  Lunges no UE assist 2X10 each Bodycraft set with 2 holes showing 7PL 2X10 with stretch into flexion Nustep seat 9 UE 7 [place] 6 minutes level 5    PATIENT EDUCATION:  Education details: HEP: walking backwards and toe raises against wall.  Person educated: Patient Education method: Explanation Education comprehension: verbalized understanding  HOME EXERCISE PROGRAM: Access Code: [email or web address] URL: [email or web address] Date: 01/16/2024 Prepared by: Seymour Bars  Exercises - Supine Quad Set  - 1-2 x daily - 2 sets - 10 reps - Sitting Knee Extension with Resistance  - 1-2 x daily - 7 x weekly - 2 sets - 10 reps - Supine Heel Slide with Strap (Mirrored)  - 1-2 x daily - 5-7 x weekly - 2 sets - 10 reps -  Sidelying Piriformis Stretch  - 1-2 x daily - 5-7 x weekly - 3 reps - 30 hold - Seated Hip Adduction Isometrics with Ball  - 1-2 x daily - 5-7 x weekly - 2 sets - 10 reps - 3 hold  Patient Education - Reciprocal Gait with Cane  02/11/24:  - Single Leg Stance  - 2 x daily - 7 x weekly - 1 sets - 5 reps - 30" hold - Standing Tandem Balance with Counter Support  - 2 x daily - 7 x weekly - 1 sets - 3 reps - 30" hold - Side Stepping with Resistance at Thighs and Counter Support  - 1 x daily - 7 x weekly - 1 sets - 5 reps ----------------------------------------------------------------------------------------------  ASSESSMENT:  CLINICAL IMPRESSION: Progress note completed today. Pt showing moderate progress with objective MMT and functional outcome measures. LEFS and ROM are plateauing. Pt wanting to avoid surgery and was educated on water based activities to increase movement. Pt willing to find out if local [facility] would accept insurance. Pt agree with plan to extend for 1x/4 weeks to maxmize ROM, LEFS and functional outcome. Pt will benefit from skilled Physical Therapy services to address deficits/limitations in order to improve functional and QOL.     PERSONAL FACTORS:  obesity  are also affecting patient's functional outcome.   REHAB POTENTIAL: Good  CLINICAL DECISION MAKING: Stable/uncomplicated  EVALUATION COMPLEXITY: Low  --------------------------------------------------------------------------------------------- GOALS: Goals reviewed with patient? No  SHORT TERM GOALS: Target date: 12/19/2023   1. Patient will be independent with a basic stretching/strengthening HEP  Baseline:  Goal status: goal met 2. Patient will be able to demonstrate right knee flexion active range of motion to 0-85 degrees to facilitate ADL completion, lifting, squatting. Baseline:  Goal status: goal partially met   LONG TERM GOALS: Target date: 01/09/2024  --> 02/27/2024      Patient will score  a  >/= 35/80 on the LEFS    to demonstrate a Minimally Clinically Importance Difference (MCID) in ADL completion, home/community ambulation, and lifting/bending/squatting. Baseline:  Goal status: goal met  2.  Patient will complete the 5 times sit to stand:    within 16s to demonstrate an improvement in ADL completion, stair negotiation, household/community ambulation, and self-care Baseline:  Goal status: goal partially met  3.  Patient will be independent with a comprehensive strengthening HEP  Baseline:  Goal status: goal partially met  4. Patient will be able to demonstrate right knee flexion active range of motion to 0-100 degrees to facilitate ADL completion, lifting, squatting. Baseline:  Goal status: goal partially met  --------------------------------------------------------------------------------------------- PLAN:  PT FREQUENCY: 1x/week  PT DURATION: 6 weeks  PLANNED INTERVENTIONS: 97110-Therapeutic exercises, 97530- Therapeutic activity, 97112- Neuromuscular re-education, 97535- Self Care, 57846- Manual therapy, [phone]- Gait training, Patient/Family education, Balance training, Stair training, Taping, Dry Needling, Joint mobilization, Joint manipulation, Spinal manipulation, Spinal mobilization, DME instructions, Cryotherapy, and Moist heat.  PLAN FOR NEXT SESSION: Progress lower extremity active range of motion/strength, try walking backwards, 1x/ 4 more weeks.   Nelida Meuse PT, DPT [facility] [place] [phone] office   02/18/2024, 3:23 PM

## 2024-02-18 NOTE — Patient Instructions (Signed)

## 2024-02-28 ENCOUNTER — Encounter (HOSPITAL_COMMUNITY): Payer: Self-pay | Admitting: Occupational Therapy

## 2024-02-28 ENCOUNTER — Ambulatory Visit (HOSPITAL_COMMUNITY): Payer: HMO

## 2024-02-28 ENCOUNTER — Ambulatory Visit (HOSPITAL_COMMUNITY): Payer: HMO | Attending: Orthopedic Surgery | Admitting: Occupational Therapy

## 2024-02-28 DIAGNOSIS — G8929 Other chronic pain: Secondary | ICD-10-CM | POA: Diagnosis not present

## 2024-02-28 DIAGNOSIS — M25511 Pain in right shoulder: Secondary | ICD-10-CM | POA: Diagnosis not present

## 2024-02-28 DIAGNOSIS — R29898 Other symptoms and signs involving the musculoskeletal system: Secondary | ICD-10-CM | POA: Diagnosis not present

## 2024-02-28 DIAGNOSIS — M6281 Muscle weakness (generalized): Secondary | ICD-10-CM | POA: Diagnosis not present

## 2024-02-28 DIAGNOSIS — R262 Difficulty in walking, not elsewhere classified: Secondary | ICD-10-CM

## 2024-02-28 DIAGNOSIS — M25611 Stiffness of right shoulder, not elsewhere classified: Secondary | ICD-10-CM | POA: Diagnosis not present

## 2024-02-28 DIAGNOSIS — M25561 Pain in right knee: Secondary | ICD-10-CM | POA: Diagnosis not present

## 2024-02-28 NOTE — Addendum Note (Signed)
 Addended by: Haydee Monica on: 02/28/2024 08:56 AM   Modules accepted: Orders

## 2024-02-28 NOTE — Therapy (Signed)
 OUTPATIENT OCCUPATIONAL THERAPY ORTHO TREATMENT   Patient Name: Karina Sawyer MRN: [id number] DOB:17-Dec-1956, 68 y.o., female Today's Date: 02/28/2024   END OF SESSION:  OT End of Session - 02/28/24 1010     Visit Number 14    Number of Visits 17    Date for OT Re-Evaluation 02/28/24    Authorization Type [employer]    Progress Note Due on Visit 19    OT Start Time 0930    OT Stop Time 1012    OT Time Calculation (min) 42 min    Activity Tolerance Patient tolerated treatment well    Behavior During Therapy [facility] for tasks assessed/performed              Past Medical History:  Diagnosis Date   Abnormal results of liver function studies    Acute maxillary sinusitis    around 2003   Arthritis    knees   Chest pain    Chest pain 2022   Per pt, she experienced a burning sensation in her chest and an abnormal EKG dated 04/26/21. She was sent to Dr. Anne Fu, cardiologist on 04/26/21. Echocardiogram demonstrated LVEF  55 - 60% and severe left atrial dilation. Patient states that she was told everything was fine, and that her chest pain was likely acid reflux.   Heart murmur    many years ago told had a murmur but no one recenctly mentioned   History of kidney stones 2003   Hyperlipidemia    Hypertension    Follows w/ PCP, Dr. Catalina Pizza takes HCTZ.   Hypothyroidism    Follows with PCP, Dr. Catalina Pizza. Currently taking Synthroid.   Menopausal and female climacteric states    Morbid obesity (HCC)    PONV (postoperative nausea and vomiting)    Patient states severe nausea and vomiting after anesthesia.   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   PCP: Dr. Nita Sells REFERRING PROVIDER: Dr. Margarita Rana  ONSET DATE: several months  REFERRING DIAG: M19.011 (ICD-10-CM) - Osteoarthritis of right shoulder, unspecified osteoarthritis type   THERAPY DIAG:  Stiffness of right shoulder, not elsewhere classified  Chronic right shoulder pain  Other symptoms and signs involving the musculoskeletal system  Rationale for Evaluation and Treatment: Rehabilitation  SUBJECTIVE:   SUBJECTIVE STATEMENT: S: "My upper arm is really sore."   PERTINENT HISTORY: Pt is a 68 y/o female presenting with chronic right shoulder pain. Pt has received several injections, 2 in the last month one of which was US guided, with some relief. Pt would like to try conservative treatment before opting for TSA.   PRECAUTIONS: None  WEIGHT BEARING RESTRICTIONS: No  PAIN:  Are you having pain? Yes: NPRS scale: 1/10 Pain location: upper arm Pain description:  sore Aggravating factors: use Relieving factors: pain cream  FALLS: Has patient fallen in last 6 months? No  PLOF: Independent  PATIENT GOALS: To be able to move my arm more.   NEXT MD VISIT: None  OBJECTIVE:   HAND DOMINANCE: Right  ADLs: Overall ADLs: I can't hardly brush my teeth or brush my hair. My hand shakes when holding a cup of water. Pt has difficulty dressing and undressing, has to have help unhooking bra. Pt is unable to reach overhead or behind back.   FUNCTIONAL OUTCOME MEASURES: FOTO: 45/100 12/26/23: 56/100 01/23/24: 58.82/100 02/18/24: 54.45/100  UPPER EXTREMITY ROM:       Assessed in sitting, er/IR adducted  Active ROM Right eval Right 12/26/23 Right 01/23/24 Right 02/18/24   Shoulder flexion 94 115 133 139  Shoulder abduction 103 123 123 123  Shoulder internal rotation 90 90 90 90  Shoulder external rotation 18 25 37 42  (Blank rows = not tested)  Assessed in supine, er/IR adducted  Passive ROM Right eval Right 12/26/23  Shoulder flexion 110 132  Shoulder abduction 108 127  Shoulder internal rotation 90 90  Shoulder external rotation 20 20  (Blank rows = not tested)    UPPER EXTREMITY MMT:     Assessed in sitting, er/IR adducted  MMT Right eval Right 12/26/23 Right 01/23/24 Right 02/18/24  Shoulder flexion 3-/5 4/5 4+/5 5/5  Shoulder abduction 3-/5 3+/5 4/5 5/5  Shoulder internal rotation 3/5 5/5 5/5 5/5  Shoulder external rotation 3-/5 4-/5 4/5 4/5  (Blank rows = not tested)  HAND FUNCTION: Grip strength: Right: 48 lbs; Left: 45 lbs  OBSERVATIONS: mod fascial restrictions along right upper arm, trapezius, and scapular regions; degenerative creptius noted with mobility   TODAY'S TREATMENT:                                                                                                                              DATE:  02/28/24 -Myofascial release to right upper arm, anterior shoulder, trapezius, and scapular regions to decrease pain and fascial restrictions and increase joint ROM -P/ROM: supine-flexion, abduction, er, 5 reps -A/ROM: supine-protraction, flexion, abduction, er, horizontal abduction, 10 reps -Functional reaching: placing 10 cones on middle shelf of overhead cabinet in flexion, removing in abduction -Theraband strengthening: red-protraction, horizontal abduction, er, flexion, abduction, 10 reps -Overhead lacing: wearing 1# wrist weight-seated, lacing from top down then reversing -UBE: level 2, 2' forward, 2' reverse, pace: 8.5  02/18/24 -Pulleys: flexion, abduction, x60" -A/ROM: seated, flexion, abduction, protraction, horizontal abduction, er/IR, x12 -Shoulder stretches: corner stretch, er stretch, 2x10" -PNF Strengthening:  red band, chest pull, head height pulls, er pulls, PNF up, PNF down, x10 -Measurements for reassessment  02/11/24 -Myofascial release to right upper arm, anterior shoulder, trapezius, and scapular regions to decrease pain and fascial restrictions and increase joint ROM -P/ROM: supine-flexion, abduction, er, 5 reps -A/ROM: seated, flexion, abduction, protraction, horizontal abduction, er/IR, x12 -Proximal shoulder strengthening: seated-paddles, criss cross, circles  each direction, 10 reps each -Scapular Strengthening: red band, extension, retraction, rows, x10 -UBE: Level 1, 2.5' forwards and backwards    PATIENT EDUCATION: Education details: reviewed HEP Person educated: Patient and Child(ren) Education method: Explanation, Demonstration, and Handouts Education comprehension: verbalized understanding and returned demonstration  HOME EXERCISE PROGRAM: Carley Hammed: AA/ROM 12/26/23: A/ROM 01/02/24: Isometrics 01/16/24: red scapular theraband 02/18/24: PNF Strengthening  GOALS: Goals reviewed with patient? Yes   SHORT TERM GOALS: Target date: 12/28/23  Pt will be provided with and educated on HEP to improve mobility in RUE required for use during ADL completion.   Goal status: IN PROGRESS  2.  Pt will increase RUE A/ROM by 50 degrees to improve ability to use RUE during dressing tasks with minimal compensatory techniques.   Goal status: REVISED  3.  Pt will increase RUE strength to 4+/5 to improve ability to reach for items at waist to chest height during dressing, bathing, and grooming tasks.   Goal status: REVISED  4.  Pt will decrease pain in RUE to 3/10 or less to improve ability to perform grooming and bathing tasks with minimal discomfort.   Goal status: MET  5.  Pt will decrease RUE fascial restrictions to min amounts or less to improve mobility required for functional reaching tasks.   Goal status: IN PROGRESS   ASSESSMENT:  CLINICAL IMPRESSION: Pt reporting increased  soreness in her upper arm/medial deltoid region. Manual techniques completed, small trigger point palpated with good response to manual techniques. Pt completing supine A/ROM to begin, then progressed to functional reaching and shoulder strengthening. Added theraband strengthening today, visual and tactile cuing for correct position. Added wrist weight for overhead lacing, pt reports greater challenge today.  Verbal cuing for form and technique during tasks, rest breaks provided as needed for fatigue.    PERFORMANCE DEFICITS: in functional skills including in functional skills including ADLs, IADLs, coordination, tone, ROM, strength, pain, fascial restrictions, muscle spasms, and UE functional use   PLAN:  OT FREQUENCY: 1x/week  OT DURATION: 4 weeks  PLANNED INTERVENTIONS: 97168 OT Re-evaluation, 97535 self care/ADL training, 16109 therapeutic exercise, 97530 therapeutic activity, 97140 manual therapy, 97014 electrical stimulation unattended, patient/family education, and DME and/or AE instructions  CONSULTED AND AGREED WITH PLAN OF CARE: Patient  PLAN FOR NEXT SESSION: Follow up on HEP, manual techniques, A/ROM, functional reaching, strengthening    Ezra Sites, OTR/L  [phone] 02/28/2024, 10:13 AM

## 2024-02-28 NOTE — Therapy (Addendum)
 [facility] OUTPATIENT PHYSICAL THERAPY TREATMENT    Patient Name: Karina Sawyer MRN: [id number] DOB:1956/09/05, 68 y.o., female Today's Date: 02/28/2024  END OF SESSION:   PT End of Session - 02/28/24 0855     Visit Number 12    Number of Visits 15    Date for PT Re-Evaluation 03/17/24    Authorization Type [employer]    Authorization Time Period no auth    Progress Note Due on Visit 15    PT Start Time [phone]   late almost 10 minutes   PT Stop Time 0930    PT Time Calculation (min) 35 min    Activity Tolerance Patient tolerated treatment well    Behavior During Therapy [facility] for tasks assessed/performed                Past Medical History:  Diagnosis Date   Abnormal results of liver function studies    Acute maxillary sinusitis    around 2003   Arthritis    knees   Chest pain    Chest pain 2022   Per pt, she experienced a burning sensation in her chest and an abnormal EKG dated 04/26/21. She was sent to Dr. Anne Fu, cardiologist on 04/26/21. Echocardiogram demonstrated LVEF  55 - 60% and severe left atrial dilation. Patient states that she was told everything was fine, and that her chest pain was likely acid reflux.   Heart murmur    many years ago told had a murmur but no one recenctly mentioned   History of kidney stones 2003   Hyperlipidemia    Hypertension    Follows w/ PCP, Dr. Catalina Pizza takes HCTZ.   Hypothyroidism    Follows with PCP, Dr. Catalina Pizza. Currently taking Synthroid.   Menopausal and female climacteric states    Morbid obesity (HCC)    PONV (postoperative nausea and vomiting)    Patient states severe nausea and vomiting after anesthesia.   Wears glasses    reading only   Past Surgical History:  Procedure Laterality Date   APPENDECTOMY  1971   COLONOSCOPY  2015   [facility] - no polyps per pt   DILATATION & CURETTAGE/HYSTEROSCOPY WITH MYOSURE N/A 12/05/2022   Procedure: DILATATION & CURETTAGE/HYSTEROSCOPY WITH MYOSURE LITE;  Surgeon: Charlett Nose, MD;  Location: [facility];  Service: Gynecology;  Laterality: N/A;  Device needed = myosure lite   NECK SURGERY     growth removed at birth   ovary removed     age 34   TUBAL LIGATION  08/24/1989   early 83's   WISDOM TOOTH EXTRACTION     many years ago   There are no active problems to display for this patient.   PCP: Benita Stabile, MDPCP - General   REFERRING PROVIDER: Sheral Apley, MDRef Provider   REFERRING DIAG: M17.11 (ICD-10-CM) - Osteoarthritis of right knee, unspecified osteoarthritis type   Rationale for Evaluation and Treatment: Rehabilitation  THERAPY DIAG:  Other symptoms and signs involving the musculoskeletal system  Right knee pain, unspecified chronicity  Muscle weakness (generalized)  Difficulty in walking, not elsewhere classified  ONSET DATE: within 3 months --------------------------------------------------------------------------------------------- SUBJECTIVE:  SUBJECTIVE STATEMENT: Doing well today and denies pain. Patient reports that her L knee popped yesterday as she was walking but is now not having any pain at the moment.  02/18/2024: Pt handed off with OT. Reports extending her OT appointments by four more weeks. No pain currently in the knee.   Progress Note 01/16/24:  Patient states she has seen improvement in her overall knee mobility and pain levels. Patient is still unable to do stairs or unable to stand long during ADLs.     IE: Within the last 3 months, patient's right knee pain has increased and has been popping. Cortisone shot to the right knee in October 2024. Patient has been referred to OPPT. (Daughter, Misty Stanley present).    PERTINENT HISTORY:  Morbid obesity  PAIN:  Are you having pain? No  PRECAUTIONS: None  RED  FLAGS: None   WEIGHT BEARING RESTRICTIONS: No  FALLS:  Has patient fallen in last 6 months? No  LIVING ENVIRONMENT: Lives with: lives alone Lives in: House/apartment Stairs: Yes: External: 2 steps; none Has following equipment at home: Single point cane and Walker - 2 wheeled  OCCUPATION: retired  PLOF: Independent  PATIENT GOALS: Patient wants to avoid right knee surgery   NEXT MD VISIT: TBD  --------------------------------------------------------------------------------------------- OBJECTIVE:   DIAGNOSTIC FINDINGS:  N/a  PATIENT SURVEYS:  LEFS 25/80  01/16/24 Lower Extremity Functional Scale (LEFS) 33/80  SCREENING FOR RED FLAGS: Bowel or bladder incontinence: No Spinal tumors: No Cauda equina syndrome: No Compression fracture: No Abdominal aneurysm: No  COGNITION: Overall cognitive status: Within functional limits for tasks assessed  POSTURE: No Significant postural limitations      FUNCTIONAL TESTS:  5 times sit to stand: 19.23s with upper extremity assist    01/16/24 5 times sit to stand: 18s with NO upper extremity assistance  2 MWT: 375 with moderate antlagia on right lower extremity; right trunk lateral shift    GAIT ANALYSIS: Distance walked: 47ft Assistive device utilized: None Level of assistance: Complete Independence Comments: Patient with moderate right lower extremity antalgia  SENSATION: [facility]    LOWER EXTREMITY MMT:    MMT Right eval Left eval R 02/18/2024 L 02/18/2024  Hip flexion 3/5     Hip extension      Hip abduction      Hip adduction      Hip internal rotation      Hip external rotation      Knee flexion 3-/5 4-/5    Knee extension 3+/5 4-/5 4-/5 4/5  Ankle dorsiflexion      Ankle plantarflexion      Ankle inversion      Ankle eversion       (Blank rows = not tested)  LOWER EXTREMITY ROM:     Active  Right eval Left eval 01/16/24 Right  R 02/18/2024 L 02/18/2024  Hip flexion       Hip extension       Hip  abduction       Hip adduction       Hip internal rotation       Hip external rotation       Knee flexion 0-80 0-90 0-82 0-74   Knee extension       Ankle dorsiflexion       Ankle plantarflexion       Ankle inversion       Ankle eversion        (Blank rows = not tested)  LOWER EXTREMITY SPECIAL TESTS Patellafemoral grind  test: positive      PALPATION: Moderate tenderness to palpation right medial knee Moderate hypermobility patella with lateral tracking  INTEGUMENTARY   WFL --------------------------------------------------------------------------------------------- TODAY'S TREATMENT:                                                                                                                              DATE: 02/28/24 Nustep seat 9 UE 11 [place] 5 minutes level 5 Gastrocnemius slant board stretch x 30" x 3 R knee drives on 12" black stool, 10 x 3 x 30" stretch at the start of each set R hamstring stretch x 30" x 3 Seated table R piriformis stretch x 30" x 3 Mini squats with adductor squeeze x 3" x 10 x 2 Heel/toe raises x 10 x 2  02/18/2024  -LEFS: 35/80 -5x Sit/stand: 14 seconds -ROM -MMT -backwards walking with sled pull 7ft x 4 -Standing toe raises against wall x 20  02/11/24; Knee drive on 16XW step 5x 10" Forward step up Bil UE A with 6in- reduced to 4in with decreased UE A 20x each Lateral step up Bil UE A with 4in step 2x 10 each Squat front of chair SLS Lt 10", Rt 10" Sidestep with GTB around thigh 3RT inside // bars Tandem stance 1x 30" on foam 1x 30" Vector stance 2x 5" with HHA Nustep seat 9 UE 11 [place] 5 minutes level 5 AROM 85 degrees flexion   02/06/24 Standing: Rt knee flexion stretch 12" 10X10"  Forward step ups bil UE assist 6" step 20X each  Lateral step ups bil UE assist 4" step 20X each  Lunges no UE assist 2X10 each Bodycraft set with 2 holes showing 7PL 2X10 with stretch into flexion Nustep seat 9 UE 7 [place] 6  minutes level 5    PATIENT EDUCATION:  Education details: HEP: walking backwards and toe raises against wall.  Person educated: Patient Education method: Explanation Education comprehension: verbalized understanding  HOME EXERCISE PROGRAM: Access Code: [email or web address] URL: [email or web address] Date: 01/16/2024 Prepared by: Seymour Bars  Exercises - Supine Quad Set  - 1-2 x daily - 2 sets - 10 reps - Sitting Knee Extension with Resistance  - 1-2 x daily - 7 x weekly - 2 sets - 10 reps - Supine Heel Slide with Strap (Mirrored)  - 1-2 x daily - 5-7 x weekly - 2 sets - 10 reps - Sidelying Piriformis Stretch  - 1-2 x daily - 5-7 x weekly - 3 reps - 30 hold - Seated Hip Adduction Isometrics with Ball  - 1-2 x daily - 5-7 x weekly - 2 sets - 10 reps - 3 hold  Patient Education - Reciprocal Gait with Cane  02/11/24:  - Single Leg Stance  - 2 x daily - 7 x weekly - 1 sets - 5 reps - 30" hold - Standing Tandem Balance with Counter Support  - 2 x daily - 7 x weekly - 1 sets - 3  reps - 30" hold - Side Stepping with Resistance at Thighs and Counter Support  - 1 x daily - 7 x weekly - 1 sets - 5 reps  02/28/2024 - Seated Table Piriformis Stretch  - 2 x daily - 7 x weekly - 3 reps - 30 hold ----------------------------------------------------------------------------------------------  ASSESSMENT:  CLINICAL IMPRESSION: Interventions today were geared towards LE strengthening and flexibility. Attempted to lessen varus deformity on the knee by improving hip ER flexibility and improving hip adductor strength. Tolerated all activities without worsening of symptoms. Demonstrated appropriate levels of fatigue. Provided slight amount of cueing to ensure correct execution of activity with good carry-over. To date, skilled PT is required to address the impairments and improve function.  02/18/24:Progress note completed today. Pt showing moderate progress with objective MMT and functional  outcome measures. LEFS and ROM are plateauing. Pt wanting to avoid surgery and was educated on water based activities to increase movement. Pt willing to find out if local [facility] would accept insurance. Pt agree with plan to extend for 1x/4 weeks to maxmize ROM, LEFS and functional outcome. Pt will benefit from skilled Physical Therapy services to address deficits/limitations in order to improve functional and QOL.     PERSONAL FACTORS:  obesity  are also affecting patient's functional outcome.   REHAB POTENTIAL: Good  CLINICAL DECISION MAKING: Stable/uncomplicated  EVALUATION COMPLEXITY: Low  --------------------------------------------------------------------------------------------- GOALS: Goals reviewed with patient? No  SHORT TERM GOALS: Target date: 12/19/2023   1. Patient will be independent with a basic stretching/strengthening HEP  Baseline:  Goal status: goal met 2. Patient will be able to demonstrate right knee flexion active range of motion to 0-85 degrees to facilitate ADL completion, lifting, squatting. Baseline:  Goal status: goal partially met   LONG TERM GOALS: Target date: 01/09/2024  --> 03/17/2024      Patient will score a >/= 35/80 on the LEFS    to demonstrate a Minimally Clinically Importance Difference (MCID) in ADL completion, home/community ambulation, and lifting/bending/squatting. Baseline:  Goal status: goal met  2.  Patient will complete the 5 times sit to stand:    within 16s to demonstrate an improvement in ADL completion, stair negotiation, household/community ambulation, and self-care Baseline:  Goal status: goal partially met  3.  Patient will be independent with a comprehensive strengthening HEP  Baseline:  Goal status: goal partially met  4. Patient will be able to demonstrate right knee flexion active range of motion to 0-100 degrees to facilitate ADL completion, lifting, squatting. Baseline:  Goal status: goal partially  met  --------------------------------------------------------------------------------------------- PLAN:  PT FREQUENCY: 1x/week  PT DURATION: 6 weeks  PLANNED INTERVENTIONS: 97110-Therapeutic exercises, 97530- Therapeutic activity, 97112- Neuromuscular re-education, 97535- Self Care, 11914- Manual therapy, [phone]- Gait training, Patient/Family education, Balance training, Stair training, Taping, Dry Needling, Joint mobilization, Joint manipulation, Spinal manipulation, Spinal mobilization, DME instructions, Cryotherapy, and Moist heat.  PLAN FOR NEXT SESSION: Progress lower extremity active range of motion/strength, try walking backwards, 1x/ 4 more weeks.   Tish Frederickson. Kalen Neidert, PT, DPT, OCS Board-Certified Clinical Specialist in Orthopedic PT PT Compact Privilege # ([place]): [id number]   02/28/2024, 9:02 AM

## 2024-03-05 ENCOUNTER — Encounter (HOSPITAL_COMMUNITY): Payer: HMO | Admitting: Occupational Therapy

## 2024-03-05 ENCOUNTER — Encounter (HOSPITAL_COMMUNITY): Payer: HMO

## 2024-03-06 ENCOUNTER — Encounter (HOSPITAL_COMMUNITY): Payer: Self-pay | Admitting: Occupational Therapy

## 2024-03-06 ENCOUNTER — Ambulatory Visit (HOSPITAL_COMMUNITY): Admitting: Occupational Therapy

## 2024-03-06 DIAGNOSIS — G8929 Other chronic pain: Secondary | ICD-10-CM

## 2024-03-06 DIAGNOSIS — R29898 Other symptoms and signs involving the musculoskeletal system: Secondary | ICD-10-CM

## 2024-03-06 DIAGNOSIS — M25611 Stiffness of right shoulder, not elsewhere classified: Secondary | ICD-10-CM

## 2024-03-06 NOTE — Therapy (Signed)
 OUTPATIENT OCCUPATIONAL THERAPY ORTHO TREATMENT   Patient Name: Karina Sawyer MRN: [id number] DOB:Oct 29, 1956, 68 y.o., female Today's Date: 03/06/2024   END OF SESSION:    03/06/24 1600  OT Visits / Re-Eval  Visit Number 15  Number of Visits 17  Date for OT Re-Evaluation 03/27/24  Authorization  Authorization Type [employer]  Progress Note Due on Visit 19  OT Time Calculation  OT Start Time 1518  OT Stop Time 1600  OT Time Calculation (min) 42 min  End of Session  Activity Tolerance Patient tolerated treatment well  Behavior During Therapy [facility] for tasks assessed/performed     Past Medical History:  Diagnosis Date   Abnormal results of liver function studies    Acute maxillary sinusitis    around 2003   Arthritis    knees   Chest pain    Chest pain 2022   Per pt, she experienced a burning sensation in her chest and an abnormal EKG dated 04/26/21. She was sent to Dr. Anne Fu, cardiologist on 04/26/21. Echocardiogram demonstrated LVEF  55 - 60% and severe left atrial dilation. Patient states that she was told everything was fine, and that her chest pain was likely acid reflux.   Heart murmur    many years ago told had a murmur but no one recenctly mentioned   History of kidney stones 2003   Hyperlipidemia    Hypertension    Follows w/ PCP, Dr. Catalina Pizza takes HCTZ.   Hypothyroidism    Follows with PCP, Dr. Catalina Pizza. Currently taking Synthroid.   Menopausal and female climacteric states    Morbid obesity (HCC)    PONV (postoperative nausea and vomiting)    Patient states severe nausea and vomiting after anesthesia.   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   PCP: Dr. Nita Sells REFERRING PROVIDER: Dr. Margarita Rana  ONSET DATE: several months  REFERRING DIAG: M19.011 (ICD-10-CM) - Osteoarthritis of right shoulder, unspecified osteoarthritis type   THERAPY DIAG:  No diagnosis found.  Rationale for Evaluation and Treatment: Rehabilitation  SUBJECTIVE:   SUBJECTIVE STATEMENT: S: "It feels like I have a charlie horse in my upper arm."   PERTINENT HISTORY: Pt is a 68 y/o female presenting with chronic right shoulder pain. Pt has received several injections, 2 in the last month one of which was US guided, with some relief. Pt would like to try conservative treatment before opting for TSA.   PRECAUTIONS: None  WEIGHT BEARING RESTRICTIONS: No  PAIN:  Are you having pain? Yes: NPRS scale: 4/10 Pain location: upper arm Pain description: sore Aggravating factors: use Relieving factors: pain cream  FALLS: Has patient fallen in last 6 months? No  PLOF: Independent  PATIENT GOALS: To be able to move my  arm more.   NEXT MD VISIT: None  OBJECTIVE:   HAND DOMINANCE: Right  ADLs: Overall ADLs: I can't hardly brush my teeth or brush my hair. My hand shakes when holding a cup of water. Pt has difficulty dressing and undressing, has to have help unhooking bra. Pt is unable to reach overhead or behind back.   FUNCTIONAL OUTCOME MEASURES: FOTO: 45/100 12/26/23: 56/100 01/23/24: 58.82/100 02/18/24: 54.45/100  UPPER EXTREMITY ROM:       Assessed in sitting, er/IR adducted  Active ROM Right eval Right 12/26/23 Right 01/23/24 Right 02/18/24  Shoulder flexion 94 115 133 139  Shoulder abduction 103 123 123 123  Shoulder  internal rotation 90 90 90 90  Shoulder external rotation 18 25 37 42  (Blank rows = not tested)  Assessed in supine, er/IR adducted  Passive ROM Right eval Right 12/26/23  Shoulder flexion 110 132  Shoulder abduction 108 127  Shoulder internal rotation 90 90  Shoulder external rotation 20 20  (Blank rows = not tested)    UPPER EXTREMITY MMT:     Assessed in sitting, er/IR adducted  MMT Right eval Right 12/26/23 Right 01/23/24 Right 02/18/24  Shoulder flexion 3-/5 4/5 4+/5 5/5  Shoulder abduction 3-/5 3+/5 4/5 5/5  Shoulder internal rotation 3/5 5/5 5/5 5/5  Shoulder external rotation 3-/5 4-/5 4/5 4/5  (Blank rows = not tested)  HAND FUNCTION: Grip strength: Right: 48 lbs; Left: 45 lbs  OBSERVATIONS: mod fascial restrictions along right upper arm, trapezius, and scapular regions; degenerative creptius noted with mobility   TODAY'S TREATMENT:                                                                                                                              DATE:   03/06/24 -Myofascial release to right upper arm, anterior shoulder, trapezius, and scapular regions to decrease pain and fascial restrictions and increase joint ROM -P/ROM: supine-flexion, abduction, er, 5 reps -A/ROM: supine-protraction, flexion, abduction, er, horizontal abduction, 10 reps -Proximal Shoulder Exercises: paddles, criss cross, circles both directions -ABC's on the wall: green weighted ball  02/28/24 -Myofascial release to right upper arm, anterior shoulder, trapezius, and scapular regions to decrease pain and fascial restrictions and increase joint ROM -P/ROM: supine-flexion, abduction, er, 5 reps -A/ROM: supine-protraction, flexion, abduction, er, horizontal abduction, 10 reps -Functional reaching: placing 10 cones on middle shelf of overhead cabinet in flexion, removing in abduction -Theraband strengthening: red-protraction, horizontal abduction, er, flexion, abduction, 10  reps -Overhead lacing: wearing 1# wrist weight-seated, lacing from top down then reversing -UBE: level 2, 2' forward, 2' reverse, pace: 8.5  02/18/24 -Pulleys: flexion, abduction, x60" -A/ROM: seated, flexion, abduction, protraction, horizontal abduction, er/IR, x12 -Shoulder stretches: corner stretch, er stretch, 2x10" -PNF Strengthening: red band, chest pull, head height pulls, er pulls, PNF up, PNF down, x10 -Measurements for reassessment   PATIENT EDUCATION: Education details: reviewed HEP Person educated: Patient and Child(ren) Education method: Explanation, Facilities manager, and Handouts Education  comprehension: verbalized understanding and returned demonstration  HOME EXERCISE PROGRAM: Carley Hammed: AA/ROM 12/26/23: A/ROM 01/02/24: Isometrics 01/16/24: red scapular theraband 02/18/24: PNF Strengthening  GOALS: Goals reviewed with patient? Yes   SHORT TERM GOALS: Target date: 12/28/23  Pt will be provided with and educated on HEP to improve mobility in RUE required for use during ADL completion.   Goal status: IN PROGRESS  2.  Pt will increase RUE A/ROM by 50 degrees to improve ability to use RUE during dressing tasks with minimal compensatory techniques.   Goal status: REVISED  3.  Pt will increase RUE strength to 4+/5 to improve ability to reach for items at waist to chest height during dressing, bathing, and grooming tasks.   Goal status: REVISED  4.  Pt will decrease pain in RUE to 3/10 or less to improve ability to perform grooming and bathing tasks with minimal discomfort.   Goal status: MET  5.  Pt will decrease RUE fascial restrictions to min amounts or less to improve mobility required for functional reaching tasks.   Goal status: IN PROGRESS   ASSESSMENT:  CLINICAL IMPRESSION: Pt continuing to have increased soreness in her upper arm/medial deltoid region that feels like a charlie horse. Manual techniques completed, small trigger point palpated with good response to  manual techniques. Pt continuing to complete ROM, which is mildly limited in abduction and horizontal abduction due to increased pain, specifically along the deltoid and biceps. Verbal and tactile cuing provided for positioning and technique throughout session.   PERFORMANCE DEFICITS: in functional skills including in functional skills including ADLs, IADLs, coordination, tone, ROM, strength, pain, fascial restrictions, muscle spasms, and UE functional use   PLAN:  OT FREQUENCY: 1x/week  OT DURATION: 4 weeks  PLANNED INTERVENTIONS: 97168 OT Re-evaluation, 97535 self care/ADL training, 16109 therapeutic exercise, 97530 therapeutic activity, 97140 manual therapy, 97014 electrical stimulation unattended, patient/family education, and DME and/or AE instructions  CONSULTED AND AGREED WITH PLAN OF CARE: Patient  PLAN FOR NEXT SESSION: Follow up on HEP, manual techniques, A/ROM, functional reaching, strengthening    Trish Mage, OTR/L [phone] 03/06/2024, 3:19 PM

## 2024-03-10 ENCOUNTER — Ambulatory Visit (HOSPITAL_COMMUNITY): Payer: HMO | Admitting: Physical Therapy

## 2024-03-10 ENCOUNTER — Encounter (HOSPITAL_COMMUNITY): Payer: HMO | Admitting: Occupational Therapy

## 2024-03-10 DIAGNOSIS — M25611 Stiffness of right shoulder, not elsewhere classified: Secondary | ICD-10-CM | POA: Diagnosis not present

## 2024-03-10 DIAGNOSIS — M6281 Muscle weakness (generalized): Secondary | ICD-10-CM

## 2024-03-10 DIAGNOSIS — M25561 Pain in right knee: Secondary | ICD-10-CM

## 2024-03-10 DIAGNOSIS — R262 Difficulty in walking, not elsewhere classified: Secondary | ICD-10-CM

## 2024-03-10 NOTE — Therapy (Signed)
 [facility] OUTPATIENT PHYSICAL THERAPY TREATMENT    Patient Name: Karina Sawyer MRN: [id number] DOB:1956-06-02, 68 y.o., female Today's Date: 03/10/2024  END OF SESSION:   PT End of Session - 03/10/24 1143     Visit Number 13    Number of Visits 15    Date for PT Re-Evaluation 03/17/24    Authorization Type [employer]    Authorization Time Period no auth    Progress Note Due on Visit 15    PT Start Time 1105    PT Stop Time 1149    PT Time Calculation (min) 44 min    Activity Tolerance Patient tolerated treatment well    Behavior During Therapy [facility] for tasks assessed/performed                 Past Medical History:  Diagnosis Date   Abnormal results of liver function studies    Acute maxillary sinusitis    around 2003   Arthritis    knees   Chest pain    Chest pain 2022   Per pt, she experienced a burning sensation in her chest and an abnormal EKG dated 04/26/21. She was sent to Dr. Anne Fu, cardiologist on 04/26/21. Echocardiogram demonstrated LVEF  55 - 60% and severe left atrial dilation. Patient states that she was told everything was fine, and that her chest pain was likely acid reflux.   Heart murmur    many years ago told had a murmur but no one recenctly mentioned   History of kidney stones 2003   Hyperlipidemia    Hypertension    Follows w/ PCP, Dr. Catalina Pizza takes HCTZ.   Hypothyroidism    Follows with PCP, Dr. Catalina Pizza. Currently taking Synthroid.   Menopausal and female climacteric states    Morbid obesity (HCC)    PONV (postoperative nausea and vomiting)    Patient states severe nausea and vomiting after anesthesia.   Wears glasses    reading only   Past Surgical History:  Procedure Laterality Date   APPENDECTOMY  1971   COLONOSCOPY  2015   [facility] - no polyps per pt   DILATATION & CURETTAGE/HYSTEROSCOPY WITH MYOSURE N/A 12/05/2022   Procedure: DILATATION & CURETTAGE/HYSTEROSCOPY WITH MYOSURE LITE;  Surgeon: Charlett Nose, MD;   Location: [facility] [place];  Service: Gynecology;  Laterality: N/A;  Device needed = myosure lite   NECK SURGERY     growth removed at birth   ovary removed     age 43   TUBAL LIGATION  08/24/1989   early 81's   WISDOM TOOTH EXTRACTION     many years ago   There are no active problems to display for this patient.   PCP: Benita Stabile, MDPCP - General   REFERRING PROVIDER: Sheral Apley, MDRef Provider   REFERRING DIAG: M17.11 (ICD-10-CM) - Osteoarthritis of right knee, unspecified osteoarthritis type   Rationale for Evaluation and Treatment: Rehabilitation  THERAPY DIAG:  Right knee pain, unspecified chronicity  Muscle weakness (generalized)  Difficulty in walking, not elsewhere classified  ONSET DATE: within 3 months --------------------------------------------------------------------------------------------- SUBJECTIVE:  SUBJECTIVE STATEMENT: Pt reports she is doing well, pain is improving but still having good and bad days.  Feels like it has a lot to do with the weather due to her arthritis.  02/18/2024: Pt handed off with OT. Reports extending her OT appointments by four more weeks. No pain currently in the knee.   Progress Note 01/16/24:  Patient states she has seen improvement in her overall knee mobility and pain levels. Patient is still unable to do stairs or unable to stand long during ADLs.     IE: Within the last 3 months, patient's right knee pain has increased and has been popping. Cortisone shot to the right knee in October 2024. Patient has been referred to OPPT. (Daughter, Misty Stanley present).    PERTINENT HISTORY:  Morbid obesity  PAIN:  Are you having pain? No  PRECAUTIONS: None  RED FLAGS: None   WEIGHT BEARING RESTRICTIONS: No  FALLS:  Has patient fallen  in last 6 months? No  LIVING ENVIRONMENT: Lives with: lives alone Lives in: House/apartment Stairs: Yes: External: 2 steps; none Has following equipment at home: Single point cane and Walker - 2 wheeled  OCCUPATION: retired  PLOF: Independent  PATIENT GOALS: Patient wants to avoid right knee surgery   NEXT MD VISIT: TBD  --------------------------------------------------------------------------------------------- OBJECTIVE:   DIAGNOSTIC FINDINGS:  N/a  PATIENT SURVEYS:  LEFS 25/80  01/16/24 Lower Extremity Functional Scale (LEFS) 33/80  SCREENING FOR RED FLAGS: Bowel or bladder incontinence: No Spinal tumors: No Cauda equina syndrome: No Compression fracture: No Abdominal aneurysm: No  COGNITION: Overall cognitive status: Within functional limits for tasks assessed  POSTURE: No Significant postural limitations      FUNCTIONAL TESTS:  5 times sit to stand: 19.23s with upper extremity assist    01/16/24 5 times sit to stand: 18s with NO upper extremity assistance  2 MWT: 375 with moderate antlagia on right lower extremity; right trunk lateral shift    GAIT ANALYSIS: Distance walked: 35ft Assistive device utilized: None Level of assistance: Complete Independence Comments: Patient with moderate right lower extremity antalgia  SENSATION: [facility]    LOWER EXTREMITY MMT:    MMT Right eval Left eval R 02/18/2024 L 02/18/2024  Hip flexion 3/5     Hip extension      Hip abduction      Hip adduction      Hip internal rotation      Hip external rotation      Knee flexion 3-/5 4-/5    Knee extension 3+/5 4-/5 4-/5 4/5  Ankle dorsiflexion      Ankle plantarflexion      Ankle inversion      Ankle eversion       (Blank rows = not tested)  LOWER EXTREMITY ROM:     Active  Right eval Left eval 01/16/24 Right  R 02/18/2024 L 02/18/2024  Hip flexion       Hip extension       Hip abduction       Hip adduction       Hip internal rotation       Hip external  rotation       Knee flexion 0-80 0-90 0-82 0-74   Knee extension       Ankle dorsiflexion       Ankle plantarflexion       Ankle inversion       Ankle eversion        (Blank rows = not tested)  LOWER EXTREMITY  SPECIAL TESTS Patellafemoral grind test: positive      PALPATION: Moderate tenderness to palpation right medial knee Moderate hypermobility patella with lateral tracking  INTEGUMENTARY   WFL --------------------------------------------------------------------------------------------- TODAY'S TREATMENT:                                                                                                                              DATE: 02/28/24 Gastrocnemius slant board stretch x 30" x 3 Heel toe raises 20X each R knee drives on 12" black stool, 10 X 10" each LE R hamstring stretch x 30" x 3 each on 12" black stool Forward step up Bil UE A with 4in step 2X10 each Lateral step up Bil UE A with 4in step 2x 10 each Mini squats with adductor squeeze x 3" x 10 x 2 Nustep seat 10 UE 10 [place] 5 minutes level 5  02/28/24 Nustep seat 9 UE 11 [place] 5 minutes level 5 Gastrocnemius slant board stretch x 30" x 3 R knee drives on 12" black stool, 10 x 3 x 30" stretch at the start of each set R hamstring stretch x 30" x 3 Seated table R piriformis stretch x 30" x 3 Mini squats with adductor squeeze x 3" x 10 x 2 Heel/toe raises x 10 x 2  02/18/2024  -LEFS: 35/80 -5x Sit/stand: 14 seconds -ROM -MMT -backwards walking with sled pull 64ft x 4 -Standing toe raises against wall x 20  02/11/24; Knee drive on 19JY step 5x 10" Forward step up Bil UE A with 6in- reduced to 4in with decreased UE A 20x each Lateral step up Bil UE A with 4in step 2x 10 each Squat front of chair SLS Lt 10", Rt 10" Sidestep with GTB around thigh 3RT inside // bars Tandem stance 1x 30" on foam 1x 30" Vector stance 2x 5" with HHA Nustep seat 9 UE 11 [place] 5 minutes level 5 AROM 85  degrees flexion   02/06/24 Standing: Rt knee flexion stretch 12" 10X10"  Forward step ups bil UE assist 6" step 20X each  Lateral step ups bil UE assist 4" step 20X each  Lunges no UE assist 2X10 each Bodycraft set with 2 holes showing 7PL 2X10 with stretch into flexion Nustep seat 9 UE 7 [place] 6 minutes level 5    PATIENT EDUCATION:  Education details: HEP: walking backwards and toe raises against wall.  Person educated: Patient Education method: Explanation Education comprehension: verbalized understanding  HOME EXERCISE PROGRAM: Access Code: [email or web address] URL: [email or web address] Date: 01/16/2024 Prepared by: Seymour Bars  Exercises - Supine Quad Set  - 1-2 x daily - 2 sets - 10 reps - Sitting Knee Extension with Resistance  - 1-2 x daily - 7 x weekly - 2 sets - 10 reps - Supine Heel Slide with Strap (Mirrored)  - 1-2 x daily - 5-7 x weekly - 2 sets - 10 reps - Sidelying Piriformis Stretch  - 1-2 x daily -  5-7 x weekly - 3 reps - 30 hold - Seated Hip Adduction Isometrics with Ball  - 1-2 x daily - 5-7 x weekly - 2 sets - 10 reps - 3 hold  Patient Education - Reciprocal Gait with Cane  02/11/24:  - Single Leg Stance  - 2 x daily - 7 x weekly - 1 sets - 5 reps - 30" hold - Standing Tandem Balance with Counter Support  - 2 x daily - 7 x weekly - 1 sets - 3 reps - 30" hold - Side Stepping with Resistance at Thighs and Counter Support  - 1 x daily - 7 x weekly - 1 sets - 5 reps  02/28/2024 - Seated Table Piriformis Stretch  - 2 x daily - 7 x weekly - 3 reps - 30 hold ----------------------------------------------------------------------------------------------  ASSESSMENT:  CLINICAL IMPRESSION: Continued focus on improving LE strengthening and flexibility. Resumed step ups today with completion of therex bilaterally. Tolerated all activities without worsening of symptoms.  Pt reported fatigue at end of session, however no rest breaks required during session.  PT did require cues for posturing and form, specifically with squats. Pt is ending her cert period and requires reassessment next session with possible discharge.   02/18/24:Progress note completed today. Pt showing moderate progress with objective MMT and functional outcome measures. LEFS and ROM are plateauing. Pt wanting to avoid surgery and was educated on water based activities to increase movement. Pt willing to find out if local [facility] would accept insurance. Pt agree with plan to extend for 1x/4 weeks to maxmize ROM, LEFS and functional outcome. Pt will benefit from skilled Physical Therapy services to address deficits/limitations in order to improve functional and QOL.     PERSONAL FACTORS:  obesity  are also affecting patient's functional outcome.   REHAB POTENTIAL: Good  CLINICAL DECISION MAKING: Stable/uncomplicated  EVALUATION COMPLEXITY: Low  --------------------------------------------------------------------------------------------- GOALS: Goals reviewed with patient? No  SHORT TERM GOALS: Target date: 12/19/2023   1. Patient will be independent with a basic stretching/strengthening HEP  Baseline:  Goal status: goal met 2. Patient will be able to demonstrate right knee flexion active range of motion to 0-85 degrees to facilitate ADL completion, lifting, squatting. Baseline:  Goal status: goal partially met   LONG TERM GOALS: Target date: 01/09/2024  --> 03/17/2024      Patient will score a >/= 35/80 on the LEFS    to demonstrate a Minimally Clinically Importance Difference (MCID) in ADL completion, home/community ambulation, and lifting/bending/squatting. Baseline:  Goal status: goal met  2.  Patient will complete the 5 times sit to stand:    within 16s to demonstrate an improvement in ADL completion, stair negotiation, household/community ambulation, and self-care Baseline:  Goal status: goal partially met  3.  Patient will be independent with a comprehensive  strengthening HEP  Baseline:  Goal status: goal partially met  4. Patient will be able to demonstrate right knee flexion active range of motion to 0-100 degrees to facilitate ADL completion, lifting, squatting. Baseline:  Goal status: goal partially met  --------------------------------------------------------------------------------------------- PLAN:  PT FREQUENCY: 1x/week  PT DURATION: 6 weeks  PLANNED INTERVENTIONS: 97110-Therapeutic exercises, 97530- Therapeutic activity, 97112- Neuromuscular re-education, 97535- Self Care, 81191- Manual therapy, [id number]- Gait training, Patient/Family education, Balance training, Stair training, Taping, Dry Needling, Joint mobilization, Joint manipulation, Spinal manipulation, Spinal mobilization, DME instructions, Cryotherapy, and Moist heat.  PLAN FOR NEXT SESSION:  complete reassessment next session.  Lurena Nida, PTA/CLT [facility]  Ph: [phone]  03/10/2024, 11:44 AM

## 2024-03-12 ENCOUNTER — Ambulatory Visit (HOSPITAL_COMMUNITY): Admitting: Occupational Therapy

## 2024-03-12 ENCOUNTER — Encounter (HOSPITAL_COMMUNITY): Payer: Self-pay | Admitting: Occupational Therapy

## 2024-03-12 DIAGNOSIS — M25611 Stiffness of right shoulder, not elsewhere classified: Secondary | ICD-10-CM

## 2024-03-12 DIAGNOSIS — G8929 Other chronic pain: Secondary | ICD-10-CM

## 2024-03-12 DIAGNOSIS — R29898 Other symptoms and signs involving the musculoskeletal system: Secondary | ICD-10-CM

## 2024-03-12 NOTE — Therapy (Signed)
 OUTPATIENT OCCUPATIONAL THERAPY ORTHO TREATMENT   Patient Name: Karina Sawyer MRN: [id number] DOB:[birth year], 69 y.o., female Today's Date: 03/12/2024   END OF SESSION:  OT End of Session - 03/12/24 1141     Visit Number 16    Number of Visits 17    Date for OT Re-Evaluation 03/27/24    Authorization Type [employer]    Progress Note Due on Visit 19    OT Start Time 1102    OT Stop Time 1143    OT Time Calculation (min) 41 min    Activity Tolerance Patient tolerated treatment well    Behavior During Therapy [facility] for tasks assessed/performed               Past Medical History:  Diagnosis Date   Abnormal results of liver function studies    Acute maxillary sinusitis    around 2003   Arthritis    knees   Chest pain    Chest pain 2022   Per pt, she experienced a burning sensation in her chest and an abnormal EKG dated 04/26/21. She was sent to Dr. Anne Fu, cardiologist on 04/26/21. Echocardiogram demonstrated LVEF  55 - 60% and severe left atrial dilation. Patient states that she was told everything was fine, and that her chest pain was likely acid reflux.   Heart murmur    many years ago told had a murmur but no one recenctly mentioned   History of kidney stones 2003   Hyperlipidemia    Hypertension    Follows w/ PCP, Dr. Catalina Pizza takes HCTZ.   Hypothyroidism    Follows with PCP, Dr. Catalina Pizza. Currently taking Synthroid.   Menopausal and female climacteric states    Morbid obesity (HCC)    PONV (postoperative nausea and vomiting)    Patient states severe nausea and vomiting after anesthesia.   Wears glasses    reading only   Past Surgical History:  Procedure Laterality Date   APPENDECTOMY  1971   COLONOSCOPY  2015   [facility] - no polyps per pt   DILATATION & CURETTAGE/HYSTEROSCOPY WITH MYOSURE N/A 12/05/2022   Procedure: DILATATION & CURETTAGE/HYSTEROSCOPY WITH MYOSURE LITE;  Surgeon: Charlett Nose, MD;  Location: [facility];   Service: Gynecology;  Laterality: N/A;  Device needed = myosure lite   NECK SURGERY     growth removed at birth   ovary removed     age 42   TUBAL LIGATION  08/24/1989   early 51's   WISDOM TOOTH EXTRACTION     many years ago   There are no active problems to display for this patient.   PCP: Dr. Nita Sells REFERRING PROVIDER: Dr. Margarita Rana  ONSET DATE: several months  REFERRING DIAG: M19.011 (ICD-10-CM) - Osteoarthritis of right shoulder, unspecified osteoarthritis type   THERAPY DIAG:  Stiffness of right shoulder, not elsewhere classified  Chronic right shoulder pain  Other symptoms and signs involving the musculoskeletal system  Rationale for Evaluation and Treatment: Rehabilitation  SUBJECTIVE:   SUBJECTIVE STATEMENT: S: "I trimmed some bushes back"  PERTINENT HISTORY: Pt is a 68 y/o female presenting with chronic right shoulder pain. Pt has received several injections, 2 in the last month one of which was US guided, with some relief. Pt would like to try conservative treatment before opting for TSA.   PRECAUTIONS: None  WEIGHT BEARING RESTRICTIONS: No  PAIN:  Are you having pain? No  FALLS: Has patient fallen in last 6 months? No  PLOF: Independent  PATIENT GOALS: To be able to move my arm more.   NEXT MD VISIT: None  OBJECTIVE:   HAND DOMINANCE: Right  ADLs: Overall ADLs: I can't hardly brush my teeth or brush my hair. My hand shakes when holding a cup of water. Pt has difficulty dressing and undressing, has to have help unhooking bra. Pt is unable to reach overhead or behind back.   FUNCTIONAL OUTCOME MEASURES: FOTO: 45/100 12/26/23: 56/100 01/23/24: 58.82/100 02/18/24: 54.45/100  UPPER EXTREMITY ROM:       Assessed in sitting, er/IR adducted  Active ROM Right eval Right 12/26/23 Right 01/23/24 Right 02/18/24  Shoulder flexion 94 115 133 139  Shoulder abduction 103 123 123 123  Shoulder internal rotation 90 90 90 90  Shoulder external  rotation 18 25 37 42  (Blank rows = not tested)  Assessed in supine, er/IR adducted  Passive ROM Right eval Right 12/26/23  Shoulder flexion 110 132  Shoulder abduction 108 127  Shoulder internal rotation 90 90  Shoulder external rotation 20 20  (Blank rows = not tested)    UPPER EXTREMITY MMT:     Assessed in sitting, er/IR adducted  MMT Right eval Right 12/26/23 Right 01/23/24 Right 02/18/24  Shoulder flexion 3-/5 4/5 4+/5 5/5  Shoulder abduction 3-/5 3+/5 4/5 5/5  Shoulder internal rotation 3/5 5/5 5/5 5/5  Shoulder external rotation 3-/5 4-/5 4/5 4/5  (Blank rows = not tested)  HAND FUNCTION: Grip strength: Right: 48 lbs; Left: 45 lbs  OBSERVATIONS: mod fascial restrictions along right upper arm, trapezius, and scapular regions; degenerative creptius noted with mobility   TODAY'S TREATMENT:                                                                                                                              DATE:  03/12/24 -Myofascial release to right upper arm, anterior shoulder, trapezius, and scapular regions to decrease pain and fascial restrictions and increase joint ROM -P/ROM: supine-flexion, abduction, er, 5 reps -Strengthening: 1#, supine-protraction, flexion, er, horizontal abduction, 10 reps -A/ROM: abduction, 10 reps -Proximal Shoulder Exercises: paddles, criss cross, circles both directions, 12 reps -Theraband strengthening: red-protraction, horizontal abduction, er, flexion, abduction, 10 reps -Ball on wall: 1' flexion -UBE: level 2, 2' forward 2' reverse, pace: 8.5  03/06/24 -Myofascial release to right upper arm, anterior shoulder, trapezius, and scapular regions to decrease pain and fascial restrictions and increase joint ROM -P/ROM: supine-flexion, abduction, er, 5 reps -A/ROM: supine-protraction, flexion, abduction, er, horizontal abduction, 10 reps -Proximal Shoulder Exercises: paddles, criss cross, circles both directions -ABC's on the  wall: green weighted ball  02/28/24 -Myofascial release to right upper arm, anterior shoulder, trapezius, and scapular regions to decrease pain and fascial restrictions and increase joint ROM -P/ROM: supine-flexion, abduction, er, 5 reps -A/ROM: supine-protraction, flexion, abduction, er, horizontal abduction, 10 reps -Functional reaching: placing 10 cones on middle shelf of overhead cabinet in flexion, removing in abduction -Theraband strengthening: red-protraction, horizontal  abduction, er, flexion, abduction, 10 reps -Overhead lacing: wearing 1# wrist weight-seated, lacing from top down then reversing -UBE: level 2, 2' forward, 2' reverse, pace: 8.5    PATIENT EDUCATION: Education details: reviewed HEP Person educated: Patient and Child(ren) Education method: Explanation, Demonstration, and Handouts Education comprehension: verbalized understanding and returned demonstration  HOME EXERCISE PROGRAM: Carley Hammed: AA/ROM 12/26/23: A/ROM 01/02/24: Isometrics 01/16/24: red scapular theraband 02/18/24: PNF Strengthening  GOALS: Goals reviewed with patient? Yes   SHORT TERM GOALS: Target date: 12/28/23  Pt will be provided with and educated on HEP to improve mobility in RUE required for use during ADL completion.   Goal status: IN PROGRESS  2.  Pt will increase RUE A/ROM by 50 degrees to improve ability to use RUE during dressing tasks with minimal compensatory techniques.   Goal status: REVISED  3.  Pt will increase RUE strength to 4+/5 to improve ability to reach for items at waist to chest height during dressing, bathing, and grooming tasks.   Goal status: REVISED  4.  Pt will decrease pain in RUE to 3/10 or less to improve ability to perform grooming and bathing tasks with minimal discomfort.   Goal status: MET  5.  Pt will decrease RUE fascial restrictions to min amounts or less to improve mobility required for functional reaching tasks.   Goal status: IN  PROGRESS   ASSESSMENT:  CLINICAL IMPRESSION: Pt reports she was able to trim some bushes and sweep her house, taking breaks as needed. Pt with improved activity tolerance today. Completing strengthening using 1# weight and resumed red theraband. Added ball on wall with good stability and able to keep ball at a consistent height. Pt with ROM at approximately 65-70%, strengthening within available ROM while focusing on form and technique. Verbal cuing during tasks, visual demonstration for new activities.   PERFORMANCE DEFICITS: in functional skills including in functional skills including ADLs, IADLs, coordination, tone, ROM, strength, pain, fascial restrictions, muscle spasms, and UE functional use   PLAN:  OT FREQUENCY: 1x/week  OT DURATION: 4 weeks  PLANNED INTERVENTIONS: 97168 OT Re-evaluation, 97535 self care/ADL training, 95621 therapeutic exercise, 97530 therapeutic activity, 97140 manual therapy, 97014 electrical stimulation unattended, patient/family education, and DME and/or AE instructions  CONSULTED AND AGREED WITH PLAN OF CARE: Patient  PLAN FOR NEXT SESSION: Follow up on HEP, manual techniques, A/ROM, functional reaching, strengthening    Ezra Sites, OTR/L  [phone] 03/12/2024, 11:44 AM

## 2024-03-17 ENCOUNTER — Ambulatory Visit (HOSPITAL_COMMUNITY): Payer: HMO | Admitting: Physical Therapy

## 2024-03-17 ENCOUNTER — Ambulatory Visit (HOSPITAL_COMMUNITY): Payer: HMO | Admitting: Occupational Therapy

## 2024-03-17 ENCOUNTER — Encounter (HOSPITAL_COMMUNITY): Payer: Self-pay | Admitting: Occupational Therapy

## 2024-03-17 ENCOUNTER — Encounter (HOSPITAL_COMMUNITY): Payer: Self-pay

## 2024-03-17 DIAGNOSIS — M6281 Muscle weakness (generalized): Secondary | ICD-10-CM

## 2024-03-17 DIAGNOSIS — M25611 Stiffness of right shoulder, not elsewhere classified: Secondary | ICD-10-CM

## 2024-03-17 DIAGNOSIS — R29898 Other symptoms and signs involving the musculoskeletal system: Secondary | ICD-10-CM

## 2024-03-17 DIAGNOSIS — G8929 Other chronic pain: Secondary | ICD-10-CM

## 2024-03-17 DIAGNOSIS — M25561 Pain in right knee: Secondary | ICD-10-CM

## 2024-03-17 NOTE — Therapy (Signed)
 [facility] Outpatient Rehabilitation at [facility] [address] [place], [place], [address] Phone: [phone]   Fax:  [phone]  Patient Details  Name: Karina Sawyer MRN: [id number] Date of Birth: 1956/07/01 Referring Provider:  No ref. provider found  Encounter Date: 03/17/2024  PHYSICAL THERAPY DISCHARGE SUMMARY  Visits from Start of Care: 14  Current functional level related to goals / functional outcomes: Met all goals except for AROM of R knee   Remaining deficits: R knee flexion ROM limitaitons   Education / Equipment:    Patient agrees to discharge. Patient goals were partially met. Patient is being discharged due to being pleased with the current functional level. PT reviewed PTA treatment note. In agreement with plan to DC pt.    Nelida Meuse, PT 03/17/2024, 12:47 PM  [facility] Outpatient Rehabilitation at [facility] [address] [place], [place], [address] Phone: [phone]   Fax:  [phone]

## 2024-03-17 NOTE — Patient Instructions (Signed)

## 2024-03-17 NOTE — Therapy (Signed)
 OUTPATIENT OCCUPATIONAL THERAPY ORTHO TREATMENT  REASSESSMENT AND DISCHARGE SUMMARY   Patient Name: Karina Sawyer MRN: [id number] DOB:01-Jan-1956, 68 y.o., female Today's Date: 03/17/2024   OCCUPATIONAL THERAPY DISCHARGE SUMMARY  Visits from Start of Care: 17  Current functional level related to goals / functional outcomes: See below. Pt is using RUE during all ADLs and I/ADLS, ROM and strength have improved and function is significantly better.    Remaining deficits: Decreased ROM    Education / Equipment: HEP for continued strengthening   Patient agrees to discharge. Patient goals were met. Patient is being discharged due to meeting the stated rehab goals..      END OF SESSION:  OT End of Session - 03/17/24 1135     Visit Number 17    Number of Visits 17    Date for OT Re-Evaluation 03/27/24    Authorization Type [employer]    Progress Note Due on Visit 19    OT Start Time 1102    OT Stop Time 1140    OT Time Calculation (min) 38 min    Activity Tolerance Patient tolerated treatment well    Behavior During Therapy WFL for tasks assessed/performed                Past Medical History:  Diagnosis Date   Abnormal results of liver function studies    Acute maxillary sinusitis    around 2003   Arthritis    knees   Chest pain    Chest pain 2022   Per pt, she experienced a burning sensation in her chest and an abnormal EKG dated 04/26/21. She was sent to Dr. Anne Fu, cardiologist on 04/26/21. Echocardiogram demonstrated LVEF  55 - 60% and severe left atrial dilation. Patient states that she was told everything was fine, and that her chest pain was likely acid reflux.   Heart murmur    many years ago told had a murmur but no one recenctly mentioned   History of kidney stones 2003   Hyperlipidemia    Hypertension    Follows w/ PCP, Dr. Catalina Pizza takes HCTZ.   Hypothyroidism    Follows with PCP, Dr. Catalina Pizza. Currently taking Synthroid.    Menopausal and female climacteric states    Morbid obesity (HCC)    PONV (postoperative nausea and vomiting)    Patient states severe nausea and vomiting after anesthesia.   Wears glasses    reading only   Past Surgical History:  Procedure Laterality Date   APPENDECTOMY  1971   COLONOSCOPY  2015   [facility] - no polyps per pt   DILATATION & CURETTAGE/HYSTEROSCOPY WITH MYOSURE N/A 12/05/2022   Procedure: DILATATION & CURETTAGE/HYSTEROSCOPY WITH MYOSURE LITE;  Surgeon: Charlett Nose, MD;  Location: [facility];  Service: Gynecology;  Laterality: N/A;  Device needed = myosure lite   NECK SURGERY     growth removed at birth   ovary removed     age 60   TUBAL LIGATION  08/24/1989   early 61's   WISDOM TOOTH EXTRACTION     many years ago   There are no active problems to display for this patient.   PCP: Dr. Nita Sells REFERRING PROVIDER: Dr. Margarita Rana  ONSET DATE: several months  REFERRING DIAG: M19.011 (ICD-10-CM) - Osteoarthritis of right shoulder, unspecified osteoarthritis type   THERAPY DIAG:  Stiffness of right shoulder, not elsewhere classified  Chronic right shoulder pain  Other symptoms and signs involving the musculoskeletal system  Rationale for Evaluation and Treatment: Rehabilitation  SUBJECTIVE:   SUBJECTIVE STATEMENT: S: "I washed and folded 4 loads of clothes, cleaned my shower, and took my trash out."  PERTINENT HISTORY: Pt is a 68 y/o female presenting with chronic right shoulder pain. Pt has received several injections, 2 in the last month one of which was US guided, with some relief. Pt would like to try conservative treatment before opting for TSA.   PRECAUTIONS: None  WEIGHT BEARING RESTRICTIONS: No  PAIN:  Are you having pain? No  FALLS: Has patient fallen in last 6 months? No  PLOF: Independent  PATIENT GOALS: To be able to move my arm more.   NEXT MD VISIT: None  OBJECTIVE:   HAND DOMINANCE:  Right  ADLs: Overall ADLs: I can't hardly brush my teeth or brush my hair. My hand shakes when holding a cup of water. Pt has difficulty dressing and undressing, has to have help unhooking bra. Pt is unable to reach overhead or behind back.   FUNCTIONAL OUTCOME MEASURES: FOTO: 45/100 12/26/23: 56/100 01/23/24: 58.82/100 02/18/24: 54.45/100  UPPER EXTREMITY ROM:       Assessed in sitting, er/IR adducted  Active ROM Right eval Right 12/26/23 Right 01/23/24 Right 02/18/24 Right 03/17/24  Shoulder flexion 94 115 133 139 140  Shoulder abduction 103 123 123 123 120  Shoulder internal rotation 90 90 90 90 90  Shoulder external rotation 18 25 37 42 30  (Blank rows = not tested)  Assessed in supine, er/IR adducted  Passive ROM Right eval Right 12/26/23  Shoulder flexion 110 132  Shoulder abduction 108 127  Shoulder internal rotation 90 90  Shoulder external rotation 20 20  (Blank rows = not tested)    UPPER EXTREMITY MMT:     Assessed in sitting, er/IR adducted  MMT Right eval Right 12/26/23 Right 01/23/24 Right 02/18/24 Right 03/17/24  Shoulder flexion 3-/5 4/5 4+/5 5/5 5/5  Shoulder abduction 3-/5 3+/5 4/5 5/5 5/5  Shoulder internal rotation 3/5 5/5 5/5 5/5 5/5  Shoulder external rotation 3-/5 4-/5 4/5 4/5 4/5  (Blank rows = not tested)  HAND FUNCTION: Grip strength: Right: 48 lbs; Left: 45 lbs  OBSERVATIONS: mod fascial restrictions along right upper arm, trapezius, and scapular regions; degenerative creptius noted with mobility   TODAY'S TREATMENT:                                                                                                                              DATE:  03/17/24 -Myofascial release to right upper arm, anterior shoulder, trapezius, and scapular regions to decrease pain and fascial restrictions and increase joint ROM -P/ROM: supine-flexion, abduction, er, 5 reps -Theraband strengthening: red-protraction, horizontal abduction, er, flexion, abduction,  10 reps  03/12/24 -Myofascial release to right upper arm, anterior shoulder, trapezius, and scapular regions to decrease pain and fascial restrictions and increase joint ROM -P/ROM: supine-flexion, abduction, er, 5 reps -Strengthening: 1#, supine-protraction, flexion, er, horizontal abduction, 10  reps -A/ROM: abduction, 10 reps -Proximal Shoulder Exercises: paddles, criss cross, circles both directions, 12 reps -Theraband strengthening: red-protraction, horizontal abduction, er, flexion, abduction, 10 reps -Ball on wall: 1' flexion -UBE: level 2, 2' forward 2' reverse, pace: 8.5  03/06/24 -Myofascial release to right upper arm, anterior shoulder, trapezius, and scapular regions to decrease pain and fascial restrictions and increase joint ROM -P/ROM: supine-flexion, abduction, er, 5 reps -A/ROM: supine-protraction, flexion, abduction, er, horizontal abduction, 10 reps -Proximal Shoulder Exercises: paddles, criss cross, circles both directions -ABC's on the wall: green weighted ball    PATIENT EDUCATION: Education details: red theraband strengthening Person educated: Patient and Child(ren) Education method: Explanation, Demonstration, and Handouts Education comprehension: verbalized understanding and returned demonstration  HOME EXERCISE PROGRAM: Eva: AA/ROM 12/26/23: A/ROM 01/02/24: Isometrics 01/16/24: red scapular theraband 02/18/24: PNF Strengthening 03/17/24: Red theraband strengthening  GOALS: Goals reviewed with patient? Yes   SHORT TERM GOALS: Target date: 12/28/23  Pt will be provided with and educated on HEP to improve mobility in RUE required for use during ADL completion.   Goal status: MET  2.  Pt will increase RUE A/ROM by 50 degrees to improve ability to use RUE during dressing tasks with minimal compensatory techniques.   Goal status: NOT MET  3.  Pt will increase RUE strength to 4+/5 to improve ability to reach for items at waist to chest height during dressing,  bathing, and grooming tasks.   Goal status: PARTIALLY MET  4.  Pt will decrease pain in RUE to 3/10 or less to improve ability to perform grooming and bathing tasks with minimal discomfort.   Goal status: MET  5.  Pt will decrease RUE fascial restrictions to min amounts or less to improve mobility required for functional reaching tasks.   Goal status: MET   ASSESSMENT:  CLINICAL IMPRESSION: Reassessment completed this session, pt has met 3/5 goals with additional goal partially met. Pt is demonstrating improved ROM and strength compared to initial evaluation, as well as decreased pain. Pt is using RUE as dominant during ADLs, reports she was able to wash and fold clothes, clean the tub, take out trash, and changed her bed yesterday. Continued with strengthening today, updating HEP for theraband strengthening. Pt is agreeable to discharge, reviewed HEPs and discussed choosing one HEP to do daily, alternating throughout the week. Pt verbalized understanding.   PERFORMANCE DEFICITS: in functional skills including in functional skills including ADLs, IADLs, coordination, tone, ROM, strength, pain, fascial restrictions, muscle spasms, and UE functional use   PLAN:  OT FREQUENCY: 1x/week  OT DURATION: 4 weeks  PLANNED INTERVENTIONS: 97168 OT Re-evaluation, 97535 self care/ADL training, 16109 therapeutic exercise, 97530 therapeutic activity, 97140 manual therapy, 97014 electrical stimulation unattended, patient/family education, and DME and/or AE instructions  CONSULTED AND AGREED WITH PLAN OF CARE: Patient  PLAN FOR NEXT SESSION: N/A-discharge today    Ezra Sites, OTR/L  [phone] 03/17/2024, 11:42 AM

## 2024-03-17 NOTE — Therapy (Signed)
 [facility] OUTPATIENT PHYSICAL THERAPY TREATMENT    Patient Name: Karina Sawyer MRN: [id number] DOB:08-Jul-1956, 68 y.o., female Today's Date: 03/17/2024  END OF SESSION:   PT End of Session - 03/17/24 1232     Visit Number 14    Number of Visits 15    Date for PT Re-Evaluation 03/17/24    Authorization Type [employer]    Authorization Time Period no auth    Progress Note Due on Visit 15    PT Start Time 1145    PT Stop Time 1202    PT Time Calculation (min) 17 min    Activity Tolerance Patient tolerated treatment well    Behavior During Therapy [facility] for tasks assessed/performed                  Past Medical History:  Diagnosis Date   Abnormal results of liver function studies    Acute maxillary sinusitis    around 2003   Arthritis    knees   Chest pain    Chest pain 2022   Per pt, she experienced a burning sensation in her chest and an abnormal EKG dated 04/26/21. She was sent to Dr. Anne Fu, cardiologist on 04/26/21. Echocardiogram demonstrated LVEF  55 - 60% and severe left atrial dilation. Patient states that she was told everything was fine, and that her chest pain was likely acid reflux.   Heart murmur    many years ago told had a murmur but no one recenctly mentioned   History of kidney stones 2003   Hyperlipidemia    Hypertension    Follows w/ PCP, Dr. Catalina Pizza takes HCTZ.   Hypothyroidism    Follows with PCP, Dr. Catalina Pizza. Currently taking Synthroid.   Menopausal and female climacteric states    Morbid obesity (HCC)    PONV (postoperative nausea and vomiting)    Patient states severe nausea and vomiting after anesthesia.   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   PCP: Benita Stabile, MDPCP - General   REFERRING PROVIDER: Sheral Apley, MDRef Provider   REFERRING DIAG: M17.11 (ICD-10-CM) - Osteoarthritis of right knee, unspecified osteoarthritis type   Rationale for Evaluation and Treatment: Rehabilitation  THERAPY DIAG:  Right knee pain, unspecified chronicity  Muscle weakness (generalized)  ONSET DATE: within 3 months --------------------------------------------------------------------------------------------- SUBJECTIVE:  SUBJECTIVE STATEMENT: Pt reports she is 100 percent better.  Cleaning the house, yardwork and all other ADL's she is completing without pain or issues.  Pt continues to be compliant with HEP.  Has not done any aquatic therapy or looked into this yet.  Currently without pain.   02/18/2024: Pt handed off with OT. Reports extending her OT appointments by four more weeks. No pain currently in the knee.   Progress Note 01/16/24:  Patient states she has seen improvement in her overall knee mobility and pain levels. Patient is still unable to do stairs or unable to stand long during ADLs.     IE: Within the last 3 months, patient's right knee pain has increased and has been popping. Cortisone shot to the right knee in October 2024. Patient has been referred to OPPT. (Daughter, Misty Stanley present).    PERTINENT HISTORY:  Morbid obesity  PAIN:  Are you having pain? No  PRECAUTIONS: None  RED FLAGS: None   WEIGHT  BEARING RESTRICTIONS: No  FALLS:  Has patient fallen in last 6 months? No  LIVING ENVIRONMENT: Lives with: lives alone Lives in: House/apartment Stairs: Yes: External: 2 steps; none Has following equipment at home: Single point cane and Walker - 2 wheeled  OCCUPATION: retired  PLOF: Independent  PATIENT GOALS: Patient wants to avoid right knee surgery   NEXT MD VISIT: TBD  --------------------------------------------------------------------------------------------- OBJECTIVE:   DIAGNOSTIC FINDINGS:  N/a  PATIENT SURVEYS:  LEFS 25/80  01/16/24 Lower Extremity Functional Scale (LEFS) 33/80  SCREENING FOR RED FLAGS: Bowel or bladder incontinence: No Spinal tumors: No Cauda equina syndrome: No Compression fracture: No Abdominal aneurysm: No  COGNITION: Overall cognitive status: Within functional limits for tasks assessed  POSTURE: No Significant postural limitations      FUNCTIONAL TESTS:  5 times sit to stand: 19.23s with upper extremity assist    01/16/24 5 times sit to stand: 18s with NO upper extremity assistance  2 MWT: 375 with moderate antlagia on right lower extremity; right trunk lateral shift    GAIT ANALYSIS: Distance walked: 62ft Assistive device utilized: None Level of assistance: Complete Independence Comments: Patient with moderate right lower extremity antalgia  SENSATION: [facility]    LOWER EXTREMITY MMT:    MMT Right eval Left eval R 02/18/2024 L 02/18/2024 Rt 03/17/24 Lt 03/17/24  Hip flexion 3/5       Hip extension        Hip abduction        Hip adduction        Hip internal rotation        Hip external rotation        Knee flexion 3-/5 4-/5   5 5   Knee extension 3+/5 4-/5 4-/5 4/5 5 5   Ankle dorsiflexion        Ankle plantarflexion        Ankle inversion        Ankle eversion         (Blank rows = not tested)  LOWER EXTREMITY ROM:     Active  Right eval Left eval 01/16/24 Right  R 02/18/2024 Rt 03/17/2024  Hip flexion        Hip extension       Hip abduction       Hip adduction       Hip internal rotation       Hip external rotation       Knee flexion 0-80 0-90 0-82 0-74 0-85  Knee extension  Ankle dorsiflexion       Ankle plantarflexion       Ankle inversion       Ankle eversion        (Blank rows = not tested)  LOWER EXTREMITY SPECIAL TESTS Patellafemoral grind test: positive      PALPATION: Moderate tenderness to palpation right medial knee Moderate hypermobility patella with lateral tracking  INTEGUMENTARY   WFL --------------------------------------------------------------------------------------------- TODAY'S TREATMENT:                                                                                                                              DATE: 03/17/24 Stair negotiation 1 at a time 5XSTS 14 seconds no UE Rt knee ROM: 0-85 Goal review HEP review  03/10/24 Gastrocnemius slant board stretch x 30" x 3 Heel toe raises 20X each R knee drives on 12" black stool, 10 X 10" each LE R hamstring stretch x 30" x 3 each on 12" black stool Forward step up Bil UE A with 4in step 2X10 each Lateral step up Bil UE A with 4in step 2x 10 each Mini squats with adductor squeeze x 3" x 10 x 2 Nustep seat 10 UE 10 [place] 5 minutes level 5  02/28/24 Nustep seat 9 UE 11 [place] 5 minutes level 5 Gastrocnemius slant board stretch x 30" x 3 R knee drives on 12" black stool, 10 x 3 x 30" stretch at the start of each set R hamstring stretch x 30" x 3 Seated table R piriformis stretch x 30" x 3 Mini squats with adductor squeeze x 3" x 10 x 2 Heel/toe raises x 10 x 2  02/18/2024  -LEFS: 35/80 -5x Sit/stand: 14 seconds -ROM -MMT -backwards walking with sled pull 66ft x 4 -Standing toe raises against wall x 20  02/11/24; Knee drive on 40JW step 5x 10" Forward step up Bil UE A with 6in- reduced to 4in with decreased UE A 20x each Lateral step up Bil UE A with 4in step 2x 10  each Squat front of chair SLS Lt 10", Rt 10" Sidestep with GTB around thigh 3RT inside // bars Tandem stance 1x 30" on foam 1x 30" Vector stance 2x 5" with HHA Nustep seat 9 UE 11 [place] 5 minutes level 5 AROM 85 degrees flexion   02/06/24 Standing: Rt knee flexion stretch 12" 10X10"  Forward step ups bil UE assist 6" step 20X each  Lateral step ups bil UE assist 4" step 20X each  Lunges no UE assist 2X10 each Bodycraft set with 2 holes showing 7PL 2X10 with stretch into flexion Nustep seat 9 UE 7 [place] 6 minutes level 5    PATIENT EDUCATION:  Education details: HEP: walking backwards and toe raises against wall.  Person educated: Patient Education method: Explanation Education comprehension: verbalized understanding  HOME EXERCISE PROGRAM: Access Code: [email or web address] URL: [email or web address] Date: 01/16/2024 Prepared by: Seymour Bars  Exercises - Supine Quad Set  -  1-2 x daily - 2 sets - 10 reps - Sitting Knee Extension with Resistance  - 1-2 x daily - 7 x weekly - 2 sets - 10 reps - Supine Heel Slide with Strap (Mirrored)  - 1-2 x daily - 5-7 x weekly - 2 sets - 10 reps - Sidelying Piriformis Stretch  - 1-2 x daily - 5-7 x weekly - 3 reps - 30 hold - Seated Hip Adduction Isometrics with Ball  - 1-2 x daily - 5-7 x weekly - 2 sets - 10 reps - 3 hold  Patient Education - Reciprocal Gait with Cane  02/11/24:  - Single Leg Stance  - 2 x daily - 7 x weekly - 1 sets - 5 reps - 30" hold - Standing Tandem Balance with Counter Support  - 2 x daily - 7 x weekly - 1 sets - 3 reps - 30" hold - Side Stepping with Resistance at Thighs and Counter Support  - 1 x daily - 7 x weekly - 1 sets - 5 reps  02/28/2024 - Seated Table Piriformis Stretch  - 2 x daily - 7 x weekly - 3 reps - 30 hold ----------------------------------------------------------------------------------------------  ASSESSMENT:  CLINICAL IMPRESSION: Re-evaluation completed this session.  Pt  has met all of her goals with ROM being only limiting factor.  Today her Rt knee flexion is 0-85 degrees meeting her Short term but with Long term goal of 100 degrees not met.  Pt is functionally independent with all ADL's and completing steps in step-to patterns but without pain.  Overall pt is very pleased with her progress and ready for discharge.  No further skilled needs at this time.   02/18/24:Progress note completed today. Pt showing moderate progress with objective MMT and functional outcome measures. LEFS and ROM are plateauing. Pt wanting to avoid surgery and was educated on water based activities to increase movement. Pt willing to find out if local [facility] would accept insurance. Pt agree with plan to extend for 1x/4 weeks to maxmize ROM, LEFS and functional outcome. Pt will benefit from skilled Physical Therapy services to address deficits/limitations in order to improve functional and QOL.     PERSONAL FACTORS:  obesity  are also affecting patient's functional outcome.   REHAB POTENTIAL: Good  CLINICAL DECISION MAKING: Stable/uncomplicated  EVALUATION COMPLEXITY: Low  --------------------------------------------------------------------------------------------- GOALS: Goals reviewed with patient? No  SHORT TERM GOALS: Target date: 12/19/2023   1. Patient will be independent with a basic stretching/strengthening HEP  Baseline:  Goal status: goal met 2. Patient will be able to demonstrate right knee flexion active range of motion to 0-85 degrees to facilitate ADL completion, lifting, squatting. Baseline:  Goal status: goal met   LONG TERM GOALS: Target date: 01/09/2024  --> 03/17/2024      Patient will score a >/= 35/80 on the LEFS    to demonstrate a Minimally Clinically Importance Difference (MCID) in ADL completion, home/community ambulation, and lifting/bending/squatting. Baseline:  Goal status: goal met  2.  Patient will complete the 5 times sit to stand:    within  16s to demonstrate an improvement in ADL completion, stair negotiation, household/community ambulation, and self-care Baseline:  Goal status: goal met  3.  Patient will be independent with a comprehensive strengthening HEP  Baseline:  Goal status: goal met  4. Patient will be able to demonstrate right knee flexion active range of motion to 0-100 degrees to facilitate ADL completion, lifting, squatting. Baseline:  Goal status: goal partially met  --------------------------------------------------------------------------------------------- PLAN:  PT FREQUENCY: 1x/week  PT DURATION: 6 weeks  PLANNED INTERVENTIONS: 97110-Therapeutic exercises, 97530- Therapeutic activity, 97112- Neuromuscular re-education, 97535- Self Care, 82956- Manual therapy, [phone]- Gait training, Patient/Family education, Balance training, Stair training, Taping, Dry Needling, Joint mobilization, Joint manipulation, Spinal manipulation, Spinal mobilization, DME instructions, Cryotherapy, and Moist heat.  PLAN FOR NEXT SESSION:  Discharge.  Lurena Nida, PTA/CLT [facility] Ph: [phone]  03/17/2024, 12:34 PM

## 2024-04-06 DIAGNOSIS — E039 Hypothyroidism, unspecified: Secondary | ICD-10-CM | POA: Diagnosis not present

## 2024-04-06 DIAGNOSIS — E782 Mixed hyperlipidemia: Secondary | ICD-10-CM | POA: Diagnosis not present

## 2024-04-06 DIAGNOSIS — R7303 Prediabetes: Secondary | ICD-10-CM | POA: Diagnosis not present

## 2024-04-13 DIAGNOSIS — Z23 Encounter for immunization: Secondary | ICD-10-CM | POA: Diagnosis not present

## 2024-04-13 DIAGNOSIS — M1990 Unspecified osteoarthritis, unspecified site: Secondary | ICD-10-CM | POA: Diagnosis not present

## 2024-04-13 DIAGNOSIS — N939 Abnormal uterine and vaginal bleeding, unspecified: Secondary | ICD-10-CM | POA: Diagnosis not present

## 2024-04-13 DIAGNOSIS — E782 Mixed hyperlipidemia: Secondary | ICD-10-CM | POA: Diagnosis not present

## 2024-04-13 DIAGNOSIS — I10 Essential (primary) hypertension: Secondary | ICD-10-CM | POA: Diagnosis not present

## 2024-04-13 DIAGNOSIS — M25511 Pain in right shoulder: Secondary | ICD-10-CM | POA: Diagnosis not present

## 2024-04-13 DIAGNOSIS — R7303 Prediabetes: Secondary | ICD-10-CM | POA: Diagnosis not present

## 2024-04-13 DIAGNOSIS — R600 Localized edema: Secondary | ICD-10-CM | POA: Diagnosis not present

## 2024-04-13 DIAGNOSIS — N951 Menopausal and female climacteric states: Secondary | ICD-10-CM | POA: Diagnosis not present

## 2024-04-13 DIAGNOSIS — R7401 Elevation of levels of liver transaminase levels: Secondary | ICD-10-CM | POA: Diagnosis not present

## 2024-04-13 DIAGNOSIS — E039 Hypothyroidism, unspecified: Secondary | ICD-10-CM | POA: Diagnosis not present

## 2024-04-29 DIAGNOSIS — H40011 Open angle with borderline findings, low risk, right eye: Secondary | ICD-10-CM | POA: Diagnosis not present

## 2024-09-04 DIAGNOSIS — M1711 Unilateral primary osteoarthritis, right knee: Secondary | ICD-10-CM | POA: Diagnosis not present

## 2024-09-08 DIAGNOSIS — I10 Essential (primary) hypertension: Secondary | ICD-10-CM | POA: Diagnosis not present

## 2024-09-08 DIAGNOSIS — Z01812 Encounter for preprocedural laboratory examination: Secondary | ICD-10-CM | POA: Diagnosis not present

## 2024-09-08 DIAGNOSIS — M1990 Unspecified osteoarthritis, unspecified site: Secondary | ICD-10-CM | POA: Diagnosis not present

## 2024-09-08 DIAGNOSIS — K7689 Other specified diseases of liver: Secondary | ICD-10-CM | POA: Diagnosis not present

## 2024-09-08 DIAGNOSIS — Z23 Encounter for immunization: Secondary | ICD-10-CM | POA: Diagnosis not present

## 2024-09-08 DIAGNOSIS — R600 Localized edema: Secondary | ICD-10-CM | POA: Diagnosis not present

## 2024-09-08 DIAGNOSIS — M1711 Unilateral primary osteoarthritis, right knee: Secondary | ICD-10-CM | POA: Diagnosis not present

## 2024-09-08 DIAGNOSIS — R7303 Prediabetes: Secondary | ICD-10-CM | POA: Diagnosis not present

## 2024-09-08 DIAGNOSIS — E782 Mixed hyperlipidemia: Secondary | ICD-10-CM | POA: Diagnosis not present

## 2024-09-08 DIAGNOSIS — E039 Hypothyroidism, unspecified: Secondary | ICD-10-CM | POA: Diagnosis not present

## 2024-09-08 DIAGNOSIS — N951 Menopausal and female climacteric states: Secondary | ICD-10-CM | POA: Diagnosis not present

## 2024-10-05 DIAGNOSIS — Z1231 Encounter for screening mammogram for malignant neoplasm of breast: Secondary | ICD-10-CM | POA: Diagnosis not present

## 2024-10-05 DIAGNOSIS — Z01419 Encounter for gynecological examination (general) (routine) without abnormal findings: Secondary | ICD-10-CM | POA: Diagnosis not present

## 2024-10-14 ENCOUNTER — Other Ambulatory Visit (HOSPITAL_BASED_OUTPATIENT_CLINIC_OR_DEPARTMENT_OTHER): Payer: Self-pay | Admitting: Obstetrics and Gynecology

## 2024-10-14 DIAGNOSIS — Z13820 Encounter for screening for osteoporosis: Secondary | ICD-10-CM

## 2024-10-15 DIAGNOSIS — Z01818 Encounter for other preprocedural examination: Secondary | ICD-10-CM | POA: Diagnosis not present

## 2024-10-15 DIAGNOSIS — R7303 Prediabetes: Secondary | ICD-10-CM | POA: Diagnosis not present

## 2024-10-15 DIAGNOSIS — M1990 Unspecified osteoarthritis, unspecified site: Secondary | ICD-10-CM | POA: Diagnosis not present

## 2024-10-15 DIAGNOSIS — E782 Mixed hyperlipidemia: Secondary | ICD-10-CM | POA: Diagnosis not present

## 2024-10-15 DIAGNOSIS — Z0000 Encounter for general adult medical examination without abnormal findings: Secondary | ICD-10-CM | POA: Diagnosis not present

## 2024-10-15 DIAGNOSIS — I10 Essential (primary) hypertension: Secondary | ICD-10-CM | POA: Diagnosis not present

## 2024-10-15 DIAGNOSIS — N951 Menopausal and female climacteric states: Secondary | ICD-10-CM | POA: Diagnosis not present

## 2024-10-15 DIAGNOSIS — R600 Localized edema: Secondary | ICD-10-CM | POA: Diagnosis not present

## 2024-10-15 DIAGNOSIS — M1711 Unilateral primary osteoarthritis, right knee: Secondary | ICD-10-CM | POA: Diagnosis not present

## 2024-10-15 DIAGNOSIS — E039 Hypothyroidism, unspecified: Secondary | ICD-10-CM | POA: Diagnosis not present

## 2024-10-15 DIAGNOSIS — K7689 Other specified diseases of liver: Secondary | ICD-10-CM | POA: Diagnosis not present

## 2024-10-26 DIAGNOSIS — M1711 Unilateral primary osteoarthritis, right knee: Secondary | ICD-10-CM | POA: Diagnosis not present

## 2024-10-26 DIAGNOSIS — R609 Edema, unspecified: Secondary | ICD-10-CM | POA: Diagnosis not present

## 2024-11-12 DIAGNOSIS — M1711 Unilateral primary osteoarthritis, right knee: Secondary | ICD-10-CM | POA: Diagnosis not present

## 2024-11-12 DIAGNOSIS — G8918 Other acute postprocedural pain: Secondary | ICD-10-CM | POA: Diagnosis not present

## 2024-11-17 DIAGNOSIS — Z4789 Encounter for other orthopedic aftercare: Secondary | ICD-10-CM | POA: Diagnosis not present

## 2024-11-17 DIAGNOSIS — M25661 Stiffness of right knee, not elsewhere classified: Secondary | ICD-10-CM | POA: Diagnosis not present

## 2024-11-17 DIAGNOSIS — M6281 Muscle weakness (generalized): Secondary | ICD-10-CM | POA: Diagnosis not present

## 2024-11-20 DIAGNOSIS — M25561 Pain in right knee: Secondary | ICD-10-CM | POA: Diagnosis not present

## 2024-11-20 DIAGNOSIS — Z4789 Encounter for other orthopedic aftercare: Secondary | ICD-10-CM | POA: Diagnosis not present

## 2024-11-20 DIAGNOSIS — M6281 Muscle weakness (generalized): Secondary | ICD-10-CM | POA: Diagnosis not present

## 2024-11-20 DIAGNOSIS — M25661 Stiffness of right knee, not elsewhere classified: Secondary | ICD-10-CM | POA: Diagnosis not present

## 2024-11-23 DIAGNOSIS — M25561 Pain in right knee: Secondary | ICD-10-CM | POA: Diagnosis not present

## 2024-11-23 DIAGNOSIS — M25661 Stiffness of right knee, not elsewhere classified: Secondary | ICD-10-CM | POA: Diagnosis not present

## 2024-11-23 DIAGNOSIS — M6281 Muscle weakness (generalized): Secondary | ICD-10-CM | POA: Diagnosis not present

## 2024-11-23 DIAGNOSIS — Z4789 Encounter for other orthopedic aftercare: Secondary | ICD-10-CM | POA: Diagnosis not present

## 2024-11-25 DIAGNOSIS — Z471 Aftercare following joint replacement surgery: Secondary | ICD-10-CM | POA: Diagnosis not present

## 2024-11-25 DIAGNOSIS — M25561 Pain in right knee: Secondary | ICD-10-CM | POA: Diagnosis not present

## 2024-11-25 DIAGNOSIS — Z96651 Presence of right artificial knee joint: Secondary | ICD-10-CM | POA: Diagnosis not present

## 2024-11-26 DIAGNOSIS — M25561 Pain in right knee: Secondary | ICD-10-CM | POA: Diagnosis not present

## 2024-11-26 DIAGNOSIS — M6281 Muscle weakness (generalized): Secondary | ICD-10-CM | POA: Diagnosis not present

## 2024-11-26 DIAGNOSIS — Z4789 Encounter for other orthopedic aftercare: Secondary | ICD-10-CM | POA: Diagnosis not present

## 2024-11-26 DIAGNOSIS — M25661 Stiffness of right knee, not elsewhere classified: Secondary | ICD-10-CM | POA: Diagnosis not present

## 2024-12-01 DIAGNOSIS — Z4789 Encounter for other orthopedic aftercare: Secondary | ICD-10-CM | POA: Diagnosis not present

## 2024-12-01 DIAGNOSIS — M25661 Stiffness of right knee, not elsewhere classified: Secondary | ICD-10-CM | POA: Diagnosis not present

## 2024-12-01 DIAGNOSIS — M6281 Muscle weakness (generalized): Secondary | ICD-10-CM | POA: Diagnosis not present

## 2024-12-01 DIAGNOSIS — M25561 Pain in right knee: Secondary | ICD-10-CM | POA: Diagnosis not present

## 2024-12-03 DIAGNOSIS — Z4789 Encounter for other orthopedic aftercare: Secondary | ICD-10-CM | POA: Diagnosis not present

## 2024-12-03 DIAGNOSIS — M25661 Stiffness of right knee, not elsewhere classified: Secondary | ICD-10-CM | POA: Diagnosis not present

## 2024-12-03 DIAGNOSIS — M25561 Pain in right knee: Secondary | ICD-10-CM | POA: Diagnosis not present

## 2024-12-03 DIAGNOSIS — M6281 Muscle weakness (generalized): Secondary | ICD-10-CM | POA: Diagnosis not present
